# Patient Record
Sex: Male | Born: 1940 | Race: White | Hispanic: No | Marital: Married | Smoking: Former smoker
Health system: Southern US, Community
[De-identification: ages and names within clinical notes are randomized; demographics above are authoritative.]

## PROBLEM LIST (undated history)

## (undated) DIAGNOSIS — K2100 Gastro-esophageal reflux disease with esophagitis, without bleeding: Secondary | ICD-10-CM

## (undated) DIAGNOSIS — M79603 Pain in arm, unspecified: Secondary | ICD-10-CM

## (undated) DIAGNOSIS — E785 Hyperlipidemia, unspecified: Secondary | ICD-10-CM

## (undated) DIAGNOSIS — M545 Low back pain, unspecified: Secondary | ICD-10-CM

## (undated) DIAGNOSIS — H9190 Unspecified hearing loss, unspecified ear: Secondary | ICD-10-CM

## (undated) DIAGNOSIS — M75101 Unspecified rotator cuff tear or rupture of right shoulder, not specified as traumatic: Secondary | ICD-10-CM

## (undated) DIAGNOSIS — K219 Gastro-esophageal reflux disease without esophagitis: Secondary | ICD-10-CM

## (undated) DIAGNOSIS — I10 Essential (primary) hypertension: Secondary | ICD-10-CM

## (undated) DIAGNOSIS — E663 Overweight: Secondary | ICD-10-CM

## (undated) DIAGNOSIS — M109 Gout, unspecified: Secondary | ICD-10-CM

## (undated) DIAGNOSIS — I714 Abdominal aortic aneurysm, without rupture, unspecified: Secondary | ICD-10-CM

## (undated) HISTORY — PX: BACK SURGERY: SHX140

## (undated) HISTORY — DX: Low back pain, unspecified: M54.50

## (undated) HISTORY — DX: Essential (primary) hypertension: I10

## (undated) HISTORY — DX: Pain in arm, unspecified: M79.603

## (undated) HISTORY — DX: Gout, unspecified: M10.9

## (undated) HISTORY — DX: Unspecified hearing loss, unspecified ear: H91.90

## (undated) HISTORY — DX: Low back pain: M54.5

## (undated) HISTORY — DX: Hyperlipidemia, unspecified: E78.5

## (undated) HISTORY — PX: ABDOMINAL AORTIC ANEURYSM REPAIR: SUR1152

## (undated) HISTORY — DX: Overweight: E66.3

## (undated) HISTORY — DX: Gastro-esophageal reflux disease with esophagitis: K21.0

## (undated) HISTORY — DX: Gastro-esophageal reflux disease with esophagitis, without bleeding: K21.00

## (undated) HISTORY — PX: COLONOSCOPY W/ BIOPSIES AND POLYPECTOMY: SHX1376

---
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
## 1999-09-25 ENCOUNTER — Encounter: Payer: Self-pay | Admitting: *Deleted

## 1999-09-26 ENCOUNTER — Ambulatory Visit: Admission: RE | Admit: 1999-09-26 | Discharge: 1999-09-26 | Payer: Self-pay | Admitting: *Deleted

## 1999-10-16 ENCOUNTER — Encounter: Payer: Self-pay | Admitting: *Deleted

## 1999-10-17 ENCOUNTER — Inpatient Hospital Stay (HOSPITAL_COMMUNITY): Admission: RE | Admit: 1999-10-17 | Discharge: 1999-10-23 | Payer: Self-pay | Admitting: *Deleted

## 1999-10-17 ENCOUNTER — Encounter: Payer: Self-pay | Admitting: *Deleted

## 1999-10-18 ENCOUNTER — Encounter: Payer: Self-pay | Admitting: *Deleted

## 1999-10-19 ENCOUNTER — Encounter: Payer: Self-pay | Admitting: *Deleted

## 1999-10-21 ENCOUNTER — Encounter: Payer: Self-pay | Admitting: *Deleted

## 1999-10-22 ENCOUNTER — Encounter: Payer: Self-pay | Admitting: *Deleted

## 2001-01-30 ENCOUNTER — Encounter: Payer: Self-pay | Admitting: Family Medicine

## 2001-01-30 ENCOUNTER — Ambulatory Visit (HOSPITAL_COMMUNITY): Admission: RE | Admit: 2001-01-30 | Discharge: 2001-01-30 | Payer: Self-pay | Admitting: Family Medicine

## 2005-11-01 ENCOUNTER — Ambulatory Visit (HOSPITAL_COMMUNITY): Admission: RE | Admit: 2005-11-01 | Discharge: 2005-11-01 | Payer: Self-pay | Admitting: Otolaryngology

## 2011-12-12 ENCOUNTER — Other Ambulatory Visit: Payer: Self-pay | Admitting: Family Medicine

## 2011-12-20 ENCOUNTER — Ambulatory Visit
Admission: RE | Admit: 2011-12-20 | Discharge: 2011-12-20 | Disposition: A | Discharge status: Home / Self Care | Payer: 59 | Source: Ambulatory Visit | Attending: Family Medicine | Admitting: Family Medicine

## 2011-12-20 ENCOUNTER — Inpatient Hospital Stay: Admission: RE | Admit: 2011-12-20 | Payer: Self-pay | Source: Ambulatory Visit

## 2011-12-20 MED ORDER — IOHEXOL 300 MG/ML  SOLN
100.0000 mL | Freq: Once | INTRAMUSCULAR | Status: AC | PRN
  Administered 2011-12-20: 100 mL via INTRAVENOUS

## 2012-11-23 ENCOUNTER — Other Ambulatory Visit: Payer: Self-pay | Admitting: Gastroenterology

## 2014-05-02 ENCOUNTER — Encounter: Payer: Self-pay | Admitting: Neurology

## 2014-05-02 DIAGNOSIS — K2100 Gastro-esophageal reflux disease with esophagitis, without bleeding: Secondary | ICD-10-CM

## 2014-05-02 DIAGNOSIS — M545 Low back pain, unspecified: Secondary | ICD-10-CM

## 2014-05-02 DIAGNOSIS — M109 Gout, unspecified: Secondary | ICD-10-CM

## 2014-05-02 DIAGNOSIS — M79603 Pain in arm, unspecified: Secondary | ICD-10-CM

## 2014-05-02 DIAGNOSIS — E663 Overweight: Secondary | ICD-10-CM

## 2014-05-02 DIAGNOSIS — E785 Hyperlipidemia, unspecified: Secondary | ICD-10-CM

## 2014-05-02 DIAGNOSIS — H9190 Unspecified hearing loss, unspecified ear: Secondary | ICD-10-CM

## 2014-05-03 ENCOUNTER — Ambulatory Visit (INDEPENDENT_AMBULATORY_CARE_PROVIDER_SITE_OTHER): Payer: 59 | Admitting: Neurology

## 2014-05-03 ENCOUNTER — Encounter (INDEPENDENT_AMBULATORY_CARE_PROVIDER_SITE_OTHER): Payer: Self-pay

## 2014-05-03 ENCOUNTER — Encounter: Payer: Self-pay | Admitting: Neurology

## 2014-05-03 VITALS — BP 118/66 | HR 60 | Ht 68.0 in | Wt 161.5 lb

## 2014-05-03 DIAGNOSIS — IMO0002 Reserved for concepts with insufficient information to code with codable children: Secondary | ICD-10-CM

## 2014-05-03 DIAGNOSIS — Z9889 Other specified postprocedural states: Secondary | ICD-10-CM

## 2014-05-03 DIAGNOSIS — M5416 Radiculopathy, lumbar region: Secondary | ICD-10-CM | POA: Insufficient documentation

## 2014-05-03 DIAGNOSIS — Z8679 Personal history of other diseases of the circulatory system: Secondary | ICD-10-CM | POA: Insufficient documentation

## 2014-05-03 NOTE — Progress Notes (Signed)
PATIENT: Kyle Greene DOB: 05/09/1941  HISTORICAL  Kyle Greene is a 73 years old right-handed male, accompanied by his wife, referred by his primary care Dr. Redmond Greene for evaluation of back, and leg pain  He had low back decompression surgery twice, the first one in 1985, for low back pain, right lower extremity radicular pain, the second one around 2000, again presenting with right sided low back pain, radiating pain to his right lower extremity, the second surgery was done by Dr. Joya Greene, surgery has helped his symptoms much, but he has persistent right foot numbness, he denies right leg weakness, denies gait difficulty  There was incidental finding of aortic aneurysm during the MRI study then, eventually underwent aortic aneurysm repair in 2005,  He is still working full-time, he does maintain his job, there was no significant difficulty, but over the past 2-3 months, since February 2015, he complained of recurrence of his right-sided low back pain, radiating pain to her right buttock, right lateral thigh, right lateral leg to the top of his right foot, worsening with prolonged standing, and walking,  He denies left leg symptoms, he denies bowel and bladder incontinence.,  He is taking ibuprofen 2 tablets every 6 hours, previously has tried Mobic, without help,    REVIEW OF SYSTEMS: Full 14 system review of systems performed and notable only for low back pain,  ALLERGIES: Allergies  Allergen Reactions  . Biaxin [Clarithromycin]   . Zocor [Simvastatin]     HOME MEDICATIONS: Current Outpatient Prescriptions on File Prior to Visit  Medication Sig Dispense Refill  . indomethacin (INDOCIN) 50 MG capsule Take 50 mg by mouth 2 (two) times daily with a meal.      . ranitidine (ZANTAC) 150 MG tablet Take 150 mg by mouth 2 (two) times daily.       No current facility-administered medications on file prior to visit.    PAST MEDICAL HISTORY: Past Medical History  Diagnosis Date    . Low back pain   . Arm pain   . Reflux esophagitis   . Gouty arthropathy   . Hearing loss   . Hyperlipemia   . High blood pressure   . Over weight     PAST SURGICAL HISTORY: Past Surgical History  Procedure Laterality Date  . Back surgery      x2    FAMILY HISTORY: Family History  Problem Relation Age of Onset  . Cancer Father   . COPD Mother     SOCIAL HISTORY:  History   Social History  . Marital Status: Married    Spouse Name: Kyle Greene    Number of Children: 1  . Years of Education: 10   Occupational History  .  [employer] Kyle Greene, maintence, climb,    Social History Main Topics  . Smoking status: Never Smoker   . Smokeless tobacco: Never Used     Comment: Quit 20 years ago.  . Alcohol Use: No  . Drug Use: No  . Sexual Activity: Not on file   Other Topics Concern  . Not on file   Social History Narrative   Patient lives at home with his wife Kyle Greene)   Patient works full time Education administrator.   Right handed.   Caffeine five [employer] dews daily.    PHYSICAL EXAM   Filed Vitals:   05/03/14 1427  BP: 118/66  Pulse: 60  Height: 5\' 8"  (1.727 m)  Weight: 161 lb 8 oz (73.256 kg)  Not recorded    Body mass index is 24.56 kg/(m^2).   Generalized: In no acute distress  Neck: Supple, no carotid bruits   Cardiac: Regular rate rhythm  Pulmonary: Clear to auscultation bilaterally  Musculoskeletal: No deformity  Neurological examination  Mentation: Alert oriented to time, place, history taking, and causual conversation  Cranial nerve II-XII: Pupils were equal round reactive to light. Extraocular movements were full.  Visual field were full on confrontational test. Bilateral fundi were sharp.  Facial sensation and strength were normal. Hearing was intact to finger rubbing bilaterally. Uvula tongue midline.  Head turning and shoulder shrug and were normal and symmetric.Tongue protrusion into cheek strength was normal.  Motor: He has mild right toe  extension weakness, right straight leg testing shoulder radiating pain from his right back to his right leg   Sensory: Intact to fine touch, pinprick, preserved vibratory sensation, and proprioception at toes.  Coordination: Normal finger to nose, heel-to-shin bilaterally there was no truncal ataxia  Gait: Rising up from seated position without assistance, normal stance, without trunk ataxia, moderate stride, good arm swing, smooth turning, able to perform tiptoe, and heel walking without difficulty.   Romberg signs: Negative  Deep tendon reflexes: Brachioradialis 2/2, biceps 2/2, triceps 2/2, patellar 2/2, Achilles 2/2, plantar responses were flexor bilaterally.   DIAGNOSTIC DATA (LABS, IMAGING, TESTING) - I reviewed patient records, labs, notes, testing and imaging myself where available.  ASSESSMENT AND PLAN  Kyle Greene is a 73 y.o. male complains of   right-sided low back pain, radiating pain to her right leg, consistent with right L5 radiculopathy  1, MRI of lumbar 2. EMG nerve conduction study 3 may consider gabapentin if he continued to have pain,.    Kyle Greene, M.D. Ph.D.  [facility] [address] [place], [place] [address] [phone]

## 2014-05-05 ENCOUNTER — Telehealth: Payer: Self-pay | Admitting: Neurology

## 2014-05-05 ENCOUNTER — Other Ambulatory Visit: Payer: Self-pay | Admitting: Neurology

## 2014-05-05 ENCOUNTER — Ambulatory Visit (INDEPENDENT_AMBULATORY_CARE_PROVIDER_SITE_OTHER): Payer: 59

## 2014-05-05 DIAGNOSIS — Z9889 Other specified postprocedural states: Secondary | ICD-10-CM

## 2014-05-05 DIAGNOSIS — M5416 Radiculopathy, lumbar region: Secondary | ICD-10-CM

## 2014-05-05 DIAGNOSIS — Z8679 Personal history of other diseases of the circulatory system: Secondary | ICD-10-CM

## 2014-05-05 DIAGNOSIS — IMO0002 Reserved for concepts with insufficient information to code with codable children: Secondary | ICD-10-CM

## 2014-05-05 NOTE — Telephone Encounter (Signed)
Pt calling stating that the Ibuprofen is not helping his back pain and would like to know if Dr. Krista Blue could prescribe something else for him. Please advise

## 2014-05-05 NOTE — Telephone Encounter (Signed)
Patient calling to state that his Ibuprofen isn't working for his back pain and would like something to be called in for him.

## 2014-05-05 NOTE — Telephone Encounter (Signed)
Butch Penny, please let patient know, I have Rx in gabapentin 300mg  tid for him, Give him an appt for EMG/NCS and MRI soon,

## 2014-05-06 MED ORDER — GABAPENTIN 100 MG PO CAPS: 300.0000 mg | ORAL_CAPSULE | Freq: Three times a day (TID) | ORAL | Status: DC

## 2014-05-06 NOTE — Telephone Encounter (Signed)
Patient calling back, still awaiting a return call.  Thanks

## 2014-05-06 NOTE — Telephone Encounter (Signed)
Patient calling to check on status of this message, please call and advise patient.

## 2014-05-06 NOTE — Telephone Encounter (Signed)
Left message that the doctor sent a prescription for Gabapentin to [facility].  Also he will get a call to schedule the EMG/NCV and MRI.

## 2014-05-09 ENCOUNTER — Telehealth: Payer: Self-pay | Admitting: Neurology

## 2014-05-10 ENCOUNTER — Telehealth: Payer: Self-pay | Admitting: *Deleted

## 2014-05-10 DIAGNOSIS — M5416 Radiculopathy, lumbar region: Secondary | ICD-10-CM

## 2014-05-10 MED ORDER — OXYCODONE-IBUPROFEN 5-400 MG PO TABS: 1.0000 | ORAL_TABLET | Freq: Four times a day (QID) | ORAL | Status: DC | PRN

## 2014-05-10 NOTE — Telephone Encounter (Signed)
Pt calling stating that the medication Gabapentin that Dr. Krista Blue prescribed is not helping and per note on 05/09/14, Dr. Leonie Man advised the pt to stop the Gabapentin and call Dr. Krista Blue. Pt states that the medication is making him dizzy and requesting pain medication. Please advise

## 2014-05-10 NOTE — Telephone Encounter (Signed)
Patient calling again to check on the status of this message, says that his medication is not helping him. Please call and advise patient.

## 2014-05-11 ENCOUNTER — Telehealth: Payer: Self-pay | Admitting: Neurology

## 2014-05-11 MED ORDER — HYDROCODONE-IBUPROFEN 7.5-200 MG PO TABS: 1.0000 | ORAL_TABLET | Freq: Three times a day (TID) | ORAL | Status: DC | PRN

## 2014-05-11 NOTE — Telephone Encounter (Signed)
I called the patient.  He is aware the Rx has been changed.

## 2014-05-11 NOTE — Telephone Encounter (Signed)
Patient brought in Rx to pharmacy for Oxycodone Ibuprofen but medication is not available--Hydrocodone with Ibuprofen is available but has less strength--if changing Rx patient will need a new written Rx--please call and advise--thank you.

## 2014-05-11 NOTE — Telephone Encounter (Signed)
Patient is requesting a med change from Combunox to Vicoprofen because Combunox is not available at this time.  If change is authorized, patient will need a new written Rx.  Please advise.  Thank you.

## 2014-05-11 NOTE — Telephone Encounter (Signed)
Kyle Greene, I changed his rx, please let him know, it is ready for him.

## 2014-05-12 ENCOUNTER — Telehealth: Payer: Self-pay | Admitting: Neurology

## 2014-05-12 DIAGNOSIS — M545 Low back pain, unspecified: Secondary | ICD-10-CM

## 2014-05-12 DIAGNOSIS — M5416 Radiculopathy, lumbar region: Secondary | ICD-10-CM

## 2014-05-12 NOTE — Telephone Encounter (Signed)
Pt picked up his Rx today. °

## 2014-05-13 ENCOUNTER — Telehealth: Payer: Self-pay | Admitting: Neurology

## 2014-05-13 NOTE — Telephone Encounter (Signed)
Patient requesting a referral to Neuro Surgeon and requesting MRI results sent as well.  He's questioning if NCV/EMG is needed on 5/26.  Please call and advise.

## 2014-05-13 NOTE — Telephone Encounter (Signed)
Patient wants to cancel nerve conduction study but wants a call back from Dr. Krista Blue or a nurse first due to other questions that he has. Please call to advise.

## 2014-05-13 NOTE — Telephone Encounter (Signed)
I have called Mr. Kyle Greene, he is hesitate about further follow up with [place], continues to have significant pain,   Hinton Dyer, please get MRI cd lumbar ready for him to pick up, I have referred him to a neurosurgeon Dr. Saunders Revel, please also cancel his appointment with me in May 26, fill with a a new patient.

## 2014-05-17 ENCOUNTER — Encounter: Payer: 59 | Admitting: Neurology

## 2014-05-17 NOTE — Telephone Encounter (Signed)
Faxed Notes and order to Dr.[facility] office in [place]. Patient will be here today to pick up MRI Disk to take to his appt.

## 2014-05-18 NOTE — Telephone Encounter (Signed)
Called pt to see if pt's matter was taken care of and pt stated that he picked up his MRI disc yesterday and I informed the pt the his referral had been sent over to Dr. Eduard Clos office and that his appt for the EMG had been cancelled. I advised the pt that if he has any other problems, questions or concerns to call the office. Pt verbalized understanding.

## 2014-05-20 NOTE — Telephone Encounter (Signed)
Correction the referral was sent to Dr. Glenna Fellows.

## 2014-05-25 NOTE — Telephone Encounter (Signed)
Received appt confirmation from [place] [facility] with Dr. Joya Salm on 05-30-14 at 1000.  Pt informed of this appt date and time per [facility]. [phone]

## 2014-06-09 ENCOUNTER — Other Ambulatory Visit: Payer: Self-pay | Admitting: Neurosurgery

## 2014-06-13 ENCOUNTER — Encounter (HOSPITAL_COMMUNITY): Payer: Self-pay | Admitting: Pharmacy Technician

## 2014-06-14 ENCOUNTER — Encounter (HOSPITAL_COMMUNITY): Payer: Self-pay | Admitting: *Deleted

## 2014-06-14 MED ORDER — CEFAZOLIN SODIUM-DEXTROSE 2-3 GM-% IV SOLR
2.0000 g | INTRAVENOUS | Status: AC
  Administered 2014-06-15: 2 g via INTRAVENOUS
  Filled 2014-06-14: qty 50

## 2014-06-14 NOTE — H&P (Signed)
Kyle Greene is an 73 y.o. male.   Chief Complaint: right leg pain HPI: patient with a history of 3 months with right leg pain associated with heaviness, weakness and numbness.he had surgery by me in the  Same area about 20 y/a. He wants to go ahead with surgery asap  Past Medical History  Diagnosis Date  . Low back pain   . Arm pain   . Reflux esophagitis   . Gouty arthropathy   . Hearing loss   . Hyperlipemia   . High blood pressure   . Over weight     Past Surgical History  Procedure Laterality Date  . Back surgery      x2  . Colonoscopy w/ biopsies and polypectomy    . Abdominal aortic aneurysm repair      Family History  Problem Relation Age of Onset  . Cancer Father   . COPD Mother    Social History:  reports that he has quit smoking. His smoking use included Cigarettes. He smoked 0.00 packs per day. He has never used smokeless tobacco. He reports that he does not drink alcohol or use illicit drugs.  Allergies:  Allergies  Allergen Reactions  . Zocor [Simvastatin] Other (See Comments)    "sore muscles"    No prescriptions prior to admission    No results found for this or any previous visit (from the past 48 hour(s)). No results found.  Review of Systems  Constitutional: Negative.   HENT: Negative.   Eyes: Negative.   Respiratory: Negative.   Cardiovascular:       Arterial hypertension  Gastrointestinal: Negative.   Genitourinary: Negative.   Musculoskeletal: Positive for back pain.  Skin: Negative.   Neurological: Positive for sensory change and focal weakness.  Endo/Heme/Allergies: Negative.   Psychiatric/Behavioral: Negative.     There were no vitals taken for this visit. Physical Exam hent, nl. Neck, nl. Cv, nl. Lungs, clear, abdomen, soft. Extremities, nl. NEURO WEAKNESS OF df RIGHT LEG. 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    Assessment/Plan PATIENT TO BE ADMITTED FOR RIGHT 3-4  4-5 LAMINOTOMIES AND  DISCECTOMIES. AWARE OF RISKS AND BENEFITS  Greene,Kyle M 06/14/2014, 4:32 PM

## 2014-06-15 ENCOUNTER — Encounter (HOSPITAL_COMMUNITY): Payer: Self-pay | Admitting: *Deleted

## 2014-06-15 ENCOUNTER — Ambulatory Visit (HOSPITAL_COMMUNITY): Payer: 59 | Admitting: Anesthesiology

## 2014-06-15 ENCOUNTER — Ambulatory Visit (HOSPITAL_COMMUNITY): Payer: 59

## 2014-06-15 ENCOUNTER — Encounter (HOSPITAL_COMMUNITY): Payer: 59 | Admitting: Anesthesiology

## 2014-06-15 ENCOUNTER — Inpatient Hospital Stay (HOSPITAL_COMMUNITY): Payer: 59

## 2014-06-15 ENCOUNTER — Encounter (HOSPITAL_COMMUNITY)
Admission: RE | Disposition: A | Discharge status: Home / Self Care | Payer: Self-pay | Source: Ambulatory Visit | Attending: Neurosurgery

## 2014-06-15 ENCOUNTER — Inpatient Hospital Stay (HOSPITAL_COMMUNITY)
Admission: RE | Admit: 2014-06-15 | Discharge: 2014-06-17 | DRG: 520 | Disposition: A | Discharge status: Home / Self Care | Payer: 59 | Source: Ambulatory Visit | Attending: Neurosurgery | Admitting: Neurosurgery

## 2014-06-15 DIAGNOSIS — M545 Low back pain, unspecified: Secondary | ICD-10-CM | POA: Diagnosis present

## 2014-06-15 DIAGNOSIS — Z87891 Personal history of nicotine dependence: Secondary | ICD-10-CM

## 2014-06-15 DIAGNOSIS — M109 Gout, unspecified: Secondary | ICD-10-CM | POA: Diagnosis present

## 2014-06-15 DIAGNOSIS — Z79899 Other long term (current) drug therapy: Secondary | ICD-10-CM | POA: Diagnosis not present

## 2014-06-15 DIAGNOSIS — M5126 Other intervertebral disc displacement, lumbar region: Principal | ICD-10-CM | POA: Diagnosis present

## 2014-06-15 DIAGNOSIS — M47817 Spondylosis without myelopathy or radiculopathy, lumbosacral region: Secondary | ICD-10-CM | POA: Diagnosis present

## 2014-06-15 DIAGNOSIS — E785 Hyperlipidemia, unspecified: Secondary | ICD-10-CM | POA: Diagnosis present

## 2014-06-15 DIAGNOSIS — H9190 Unspecified hearing loss, unspecified ear: Secondary | ICD-10-CM | POA: Diagnosis present

## 2014-06-15 HISTORY — PX: LUMBAR LAMINECTOMY/DECOMPRESSION MICRODISCECTOMY: SHX5026

## 2014-06-15 LAB — BASIC METABOLIC PANEL
BUN: 16 mg/dL (ref 6–23)
CALCIUM: 9.7 mg/dL (ref 8.4–10.5)
CO2: 25 mEq/L (ref 19–32)
CREATININE: 1.05 mg/dL (ref 0.50–1.35)
Chloride: 99 mEq/L (ref 96–112)
GFR, EST AFRICAN AMERICAN: 80 mL/min — AB (ref >90)
GFR, EST NON AFRICAN AMERICAN: 69 mL/min — AB (ref >90)
Glucose, Bld: 92 mg/dL (ref 70–99)
POTASSIUM: 4.2 meq/L (ref 3.7–5.3)
Sodium: 139 mEq/L (ref 137–147)

## 2014-06-15 LAB — CBC
HCT: 43.5 % (ref 39.0–52.0)
HEMOGLOBIN: 15.1 g/dL (ref 13.0–17.0)
MCH: 30.4 pg (ref 26.0–34.0)
MCHC: 34.7 g/dL (ref 30.0–36.0)
MCV: 87.5 fL (ref 78.0–100.0)
Platelets: 243 10*3/uL (ref 150–400)
RBC: 4.97 MIL/uL (ref 4.22–5.81)
RDW: 12.9 % (ref 11.5–15.5)
WBC: 10.5 10*3/uL (ref 4.0–10.5)

## 2014-06-15 LAB — SURGICAL PCR SCREEN
MRSA, PCR: NEGATIVE
Staphylococcus aureus: POSITIVE — AB

## 2014-06-15 SURGERY — LUMBAR LAMINECTOMY/DECOMPRESSION MICRODISCECTOMY 2 LEVELS
Anesthesia: General | Laterality: Right

## 2014-06-15 MED ORDER — FENTANYL CITRATE 0.05 MG/ML IJ SOLN
INTRAMUSCULAR | Status: DC | PRN
  Administered 2014-06-15: 50 ug via INTRAVENOUS
  Administered 2014-06-15: 150 ug via INTRAVENOUS
  Administered 2014-06-15: 50 ug via INTRAVENOUS

## 2014-06-15 MED ORDER — GLYCOPYRROLATE 0.2 MG/ML IJ SOLN
INTRAMUSCULAR | Status: DC | PRN
  Administered 2014-06-15: .4 mg via INTRAVENOUS

## 2014-06-15 MED ORDER — 0.9 % SODIUM CHLORIDE (POUR BTL) OPTIME
TOPICAL | Status: DC | PRN
  Administered 2014-06-15: 1000 mL

## 2014-06-15 MED ORDER — THROMBIN 5000 UNITS EX SOLR
CUTANEOUS | Status: DC | PRN
  Administered 2014-06-15 (×2): 5000 [IU] via TOPICAL

## 2014-06-15 MED ORDER — ROCURONIUM BROMIDE 100 MG/10ML IV SOLN
INTRAVENOUS | Status: DC | PRN
  Administered 2014-06-15: 50 mg via INTRAVENOUS

## 2014-06-15 MED ORDER — EPHEDRINE SULFATE 50 MG/ML IJ SOLN
INTRAMUSCULAR | Status: DC | PRN
  Administered 2014-06-15: 10 mg via INTRAVENOUS

## 2014-06-15 MED ORDER — ONDANSETRON HCL 4 MG/2ML IJ SOLN: 4.0000 mg | INTRAMUSCULAR | Status: DC | PRN

## 2014-06-15 MED ORDER — HEMOSTATIC AGENTS (NO CHARGE) OPTIME
TOPICAL | Status: DC | PRN
  Administered 2014-06-15: 1 via TOPICAL

## 2014-06-15 MED ORDER — FENTANYL CITRATE 0.05 MG/ML IJ SOLN
INTRAMUSCULAR | Status: AC
  Filled 2014-06-15: qty 5

## 2014-06-15 MED ORDER — OXYCODONE HCL 5 MG/5ML PO SOLN: 5.0000 mg | Freq: Once | ORAL | Status: DC | PRN

## 2014-06-15 MED ORDER — CEFAZOLIN SODIUM 1-5 GM-% IV SOLN
1.0000 g | Freq: Three times a day (TID) | INTRAVENOUS | Status: AC
  Administered 2014-06-15 – 2014-06-16 (×2): 1 g via INTRAVENOUS
  Filled 2014-06-15 (×3): qty 50

## 2014-06-15 MED ORDER — PHENOL 1.4 % MT LIQD: 1.0000 | OROMUCOSAL | Status: DC | PRN

## 2014-06-15 MED ORDER — MUPIROCIN 2 % EX OINT
TOPICAL_OINTMENT | Freq: Two times a day (BID) | CUTANEOUS | Status: DC
  Administered 2014-06-15: 23:00:00 via NASAL
  Administered 2014-06-15: 1 via NASAL
  Administered 2014-06-16: 10:00:00 via NASAL
  Filled 2014-06-15: qty 22

## 2014-06-15 MED ORDER — BUPIVACAINE-EPINEPHRINE (PF) 0.5% -1:200000 IJ SOLN
INTRAMUSCULAR | Status: DC | PRN
  Administered 2014-06-15: 20 mL via PERINEURAL

## 2014-06-15 MED ORDER — SODIUM CHLORIDE 0.9 % IV SOLN: 250.0000 mL | INTRAVENOUS | Status: DC

## 2014-06-15 MED ORDER — ACETAMINOPHEN 650 MG RE SUPP: 650.0000 mg | RECTAL | Status: DC | PRN

## 2014-06-15 MED ORDER — SODIUM CHLORIDE 0.9 % IJ SOLN
3.0000 mL | Freq: Two times a day (BID) | INTRAMUSCULAR | Status: DC
  Administered 2014-06-15 – 2014-06-16 (×3): 3 mL via INTRAVENOUS

## 2014-06-15 MED ORDER — FENTANYL CITRATE 0.05 MG/ML IJ SOLN
INTRAMUSCULAR | Status: AC
  Filled 2014-06-15: qty 2

## 2014-06-15 MED ORDER — SODIUM CHLORIDE 0.9 % IV SOLN
INTRAVENOUS | Status: DC
  Administered 2014-06-16: 02:00:00 via INTRAVENOUS

## 2014-06-15 MED ORDER — FAMOTIDINE 20 MG PO TABS
20.0000 mg | ORAL_TABLET | Freq: Every day | ORAL | Status: DC
  Administered 2014-06-16 – 2014-06-17 (×2): 20 mg via ORAL
  Filled 2014-06-15 (×2): qty 1

## 2014-06-15 MED ORDER — MORPHINE SULFATE 2 MG/ML IJ SOLN: 1.0000 mg | INTRAMUSCULAR | Status: DC | PRN

## 2014-06-15 MED ORDER — ACETAMINOPHEN 325 MG PO TABS: 650.0000 mg | ORAL_TABLET | ORAL | Status: DC | PRN

## 2014-06-15 MED ORDER — METHYLPREDNISOLONE ACETATE 80 MG/ML IJ SUSP
INTRAMUSCULAR | Status: DC | PRN
  Administered 2014-06-15: 80 mg

## 2014-06-15 MED ORDER — MEPERIDINE HCL 25 MG/ML IJ SOLN: 6.2500 mg | INTRAMUSCULAR | Status: DC | PRN

## 2014-06-15 MED ORDER — FENTANYL CITRATE 0.05 MG/ML IJ SOLN
INTRAMUSCULAR | Status: DC | PRN
  Administered 2014-06-15: 100 ug via INTRAVENOUS

## 2014-06-15 MED ORDER — ZOLPIDEM TARTRATE 5 MG PO TABS: 5.0000 mg | ORAL_TABLET | Freq: Every evening | ORAL | Status: DC | PRN

## 2014-06-15 MED ORDER — MIDAZOLAM HCL 2 MG/2ML IJ SOLN
INTRAMUSCULAR | Status: AC
  Filled 2014-06-15: qty 2

## 2014-06-15 MED ORDER — LACTATED RINGERS IV SOLN
INTRAVENOUS | Status: DC
  Administered 2014-06-15: 14:00:00 via INTRAVENOUS
  Administered 2014-06-15: 50 mL/h via INTRAVENOUS

## 2014-06-15 MED ORDER — ONDANSETRON HCL 4 MG/2ML IJ SOLN
INTRAMUSCULAR | Status: DC | PRN
Start: 2014-06-15 — End: 2014-06-15
  Administered 2014-06-15: 4 mg via INTRAVENOUS

## 2014-06-15 MED ORDER — NEOSTIGMINE METHYLSULFATE 10 MG/10ML IV SOLN
INTRAVENOUS | Status: DC | PRN
  Administered 2014-06-15: 3 mg via INTRAVENOUS

## 2014-06-15 MED ORDER — SODIUM CHLORIDE 0.9 % IJ SOLN: 3.0000 mL | INTRAMUSCULAR | Status: DC | PRN

## 2014-06-15 MED ORDER — HYDROCHLOROTHIAZIDE 12.5 MG PO CAPS
12.5000 mg | ORAL_CAPSULE | Freq: Every day | ORAL | Status: DC
  Administered 2014-06-16 – 2014-06-17 (×2): 12.5 mg via ORAL
  Filled 2014-06-15 (×2): qty 1

## 2014-06-15 MED ORDER — LIDOCAINE HCL (CARDIAC) 20 MG/ML IV SOLN
INTRAVENOUS | Status: DC | PRN
  Administered 2014-06-15: 100 mg via INTRAVENOUS

## 2014-06-15 MED ORDER — MENTHOL 3 MG MT LOZG
1.0000 | LOZENGE | OROMUCOSAL | Status: DC | PRN
  Administered 2014-06-15: 3 mg via ORAL
  Filled 2014-06-15: qty 9

## 2014-06-15 MED ORDER — ONDANSETRON HCL 4 MG/2ML IJ SOLN: 4.0000 mg | Freq: Once | INTRAMUSCULAR | Status: DC | PRN

## 2014-06-15 MED ORDER — OXYCODONE HCL 5 MG PO TABS: 5.0000 mg | ORAL_TABLET | Freq: Once | ORAL | Status: DC | PRN

## 2014-06-15 MED ORDER — DIAZEPAM 5 MG PO TABS
5.0000 mg | ORAL_TABLET | Freq: Four times a day (QID) | ORAL | Status: DC | PRN
  Administered 2014-06-16: 5 mg via ORAL
  Filled 2014-06-15: qty 1

## 2014-06-15 MED ORDER — HYDROMORPHONE HCL PF 1 MG/ML IJ SOLN
INTRAMUSCULAR | Status: AC
  Filled 2014-06-15: qty 1

## 2014-06-15 MED ORDER — HYDROMORPHONE HCL PF 1 MG/ML IJ SOLN
0.2500 mg | INTRAMUSCULAR | Status: DC | PRN
  Administered 2014-06-15: 0.5 mg via INTRAVENOUS

## 2014-06-15 MED ORDER — MUPIROCIN 2 % EX OINT
TOPICAL_OINTMENT | CUTANEOUS | Status: AC
  Filled 2014-06-15: qty 22

## 2014-06-15 MED ORDER — MIDAZOLAM HCL 5 MG/5ML IJ SOLN
INTRAMUSCULAR | Status: DC | PRN
  Administered 2014-06-15: 2 mg via INTRAVENOUS

## 2014-06-15 MED ORDER — OXYCODONE-ACETAMINOPHEN 5-325 MG PO TABS
1.0000 | ORAL_TABLET | ORAL | Status: DC | PRN
  Administered 2014-06-16: 2 via ORAL
  Filled 2014-06-15: qty 2

## 2014-06-15 MED ORDER — PROPOFOL 10 MG/ML IV BOLUS
INTRAVENOUS | Status: DC | PRN
  Administered 2014-06-15: 110 mg via INTRAVENOUS

## 2014-06-15 SURGICAL SUPPLY — 61 items
APL SKNCLS STERI-STRIP NONHPOA (GAUZE/BANDAGES/DRESSINGS) ×1
BENZOIN TINCTURE PRP APPL 2/3 (GAUZE/BANDAGES/DRESSINGS) ×3 IMPLANT
BLADE 10 SAFETY STRL DISP (BLADE) ×3 IMPLANT
BLADE SURG ROTATE 9660 (MISCELLANEOUS) ×2 IMPLANT
BUR ACORN 6.0 (BURR) ×1 IMPLANT
BUR ACORN 6.0MM (BURR) ×1
BUR MATCHSTICK NEURO 3.0 LAGG (BURR) ×3 IMPLANT
CANISTER SUCT 3000ML (MISCELLANEOUS) ×3 IMPLANT
CLOSURE WOUND 1/2 X4 (GAUZE/BANDAGES/DRESSINGS) ×1
CONT SPEC 4OZ CLIKSEAL STRL BL (MISCELLANEOUS) ×3 IMPLANT
DRAPE LAPAROTOMY 100X72X124 (DRAPES) ×5 IMPLANT
DRAPE POUCH INSTRU U-SHP 10X18 (DRAPES) ×3 IMPLANT
DRSG OPSITE 4X5.5 SM (GAUZE/BANDAGES/DRESSINGS) ×2 IMPLANT
DURAPREP 26ML APPLICATOR (WOUND CARE) ×3 IMPLANT
ELECT REM PT RETURN 9FT ADLT (ELECTROSURGICAL) ×3
ELECTRODE REM PT RTRN 9FT ADLT (ELECTROSURGICAL) ×1 IMPLANT
GAUZE SPONGE 4X4 16PLY XRAY LF (GAUZE/BANDAGES/DRESSINGS) ×2 IMPLANT
GLOVE BIOGEL M 8.0 STRL (GLOVE) ×3 IMPLANT
GLOVE BIOGEL PI IND STRL 7.5 (GLOVE) IMPLANT
GLOVE BIOGEL PI INDICATOR 7.5 (GLOVE) ×6
GLOVE EXAM NITRILE LRG STRL (GLOVE) IMPLANT
GLOVE EXAM NITRILE MD LF STRL (GLOVE) IMPLANT
GLOVE EXAM NITRILE XL STR (GLOVE) IMPLANT
GLOVE EXAM NITRILE XS STR PU (GLOVE) IMPLANT
GLOVE SURG SS PI 7.0 STRL IVOR (GLOVE) ×8 IMPLANT
GOWN STRL REUS W/ TWL LRG LVL3 (GOWN DISPOSABLE) ×1 IMPLANT
GOWN STRL REUS W/ TWL XL LVL3 (GOWN DISPOSABLE) IMPLANT
GOWN STRL REUS W/TWL 2XL LVL3 (GOWN DISPOSABLE) IMPLANT
GOWN STRL REUS W/TWL LRG LVL3 (GOWN DISPOSABLE) ×6
GOWN STRL REUS W/TWL XL LVL3 (GOWN DISPOSABLE) ×9
KIT BASIN OR (CUSTOM PROCEDURE TRAY) ×3 IMPLANT
KIT ROOM TURNOVER OR (KITS) ×3 IMPLANT
NDL HYPO 18GX1.5 BLUNT FILL (NEEDLE) IMPLANT
NDL HYPO 21X1.5 SAFETY (NEEDLE) IMPLANT
NDL HYPO 25X1 1.5 SAFETY (NEEDLE) IMPLANT
NDL SPNL 20GX3.5 QUINCKE YW (NEEDLE) IMPLANT
NEEDLE HYPO 18GX1.5 BLUNT FILL (NEEDLE) IMPLANT
NEEDLE HYPO 21X1.5 SAFETY (NEEDLE) IMPLANT
NEEDLE HYPO 25X1 1.5 SAFETY (NEEDLE) ×3 IMPLANT
NEEDLE SPNL 20GX3.5 QUINCKE YW (NEEDLE) IMPLANT
NS IRRIG 1000ML POUR BTL (IV SOLUTION) ×3 IMPLANT
PACK LAMINECTOMY NEURO (CUSTOM PROCEDURE TRAY) ×3 IMPLANT
PAD ABD 8X10 STRL (GAUZE/BANDAGES/DRESSINGS) IMPLANT
PAD ARMBOARD 7.5X6 YLW CONV (MISCELLANEOUS) ×9 IMPLANT
PATTIES SURGICAL .5 X1 (DISPOSABLE) ×3 IMPLANT
RUBBERBAND STERILE (MISCELLANEOUS) ×6 IMPLANT
SPONGE GAUZE 4X4 12PLY (GAUZE/BANDAGES/DRESSINGS) ×3 IMPLANT
SPONGE LAP 4X18 X RAY DECT (DISPOSABLE) IMPLANT
SPONGE SURGIFOAM ABS GEL SZ50 (HEMOSTASIS) ×3 IMPLANT
STRIP CLOSURE SKIN 1/2X4 (GAUZE/BANDAGES/DRESSINGS) ×2 IMPLANT
SUT VIC AB 0 CT1 18XCR BRD8 (SUTURE) ×1 IMPLANT
SUT VIC AB 0 CT1 8-18 (SUTURE) ×3
SUT VIC AB 2-0 CP2 18 (SUTURE) ×3 IMPLANT
SUT VIC AB 3-0 SH 8-18 (SUTURE) ×3 IMPLANT
SYR 20CC LL (SYRINGE) IMPLANT
SYR 20ML ECCENTRIC (SYRINGE) ×3 IMPLANT
SYR 5ML LL (SYRINGE) IMPLANT
TAPE CLOTH SURG 4X10 WHT LF (GAUZE/BANDAGES/DRESSINGS) ×2 IMPLANT
TOWEL OR 17X24 6PK STRL BLUE (TOWEL DISPOSABLE) ×3 IMPLANT
TOWEL OR 17X26 10 PK STRL BLUE (TOWEL DISPOSABLE) ×3 IMPLANT
WATER STERILE IRR 1000ML POUR (IV SOLUTION) ×3 IMPLANT

## 2014-06-15 NOTE — Anesthesia Postprocedure Evaluation (Signed)
Anesthesia Post Note  Patient: Kyle Greene  Procedure(s) Performed: Procedure(s) (LRB): LUMBAR THREE TO FOUR, LUMBAR FOUR TO FIVE LUMBAR LAMINECTOMY/DECOMPRESSION MICRODISCECTOMY 2 LEVELS (Right)  Anesthesia type: general  Patient location: PACU  Post pain: Pain level controlled  Post assessment: Patient's Cardiovascular Status Stable  Last Vitals:  Filed Vitals:   06/15/14 1611  BP:   Pulse: 56  Temp: 36 C  Resp: 15    Post vital signs: Reviewed and stable  Level of consciousness: sedated  Complications: No apparent anesthesia complications

## 2014-06-15 NOTE — Transfer of Care (Signed)
Immediate Anesthesia Transfer of Care Note  Patient: Kyle Greene  Procedure(s) Performed: Procedure(s) with comments: LUMBAR THREE TO FOUR, LUMBAR FOUR TO FIVE LUMBAR LAMINECTOMY/DECOMPRESSION MICRODISCECTOMY 2 LEVELS (Right) - right L3-4 L4-5 Microdiskectomy  Patient Location: PACU  Anesthesia Type:General  Level of Consciousness: awake, alert  and oriented  Airway & Oxygen Therapy: Patient Spontanous Breathing and Patient connected to nasal cannula oxygen  Post-op Assessment: Report given to PACU RN and Post -op Vital signs reviewed and stable  Post vital signs: Reviewed and stable  Complications: No apparent anesthesia complications

## 2014-06-15 NOTE — Plan of Care (Signed)
Problem: Consults Goal: Diagnosis - Spinal Surgery Microdiscectomy

## 2014-06-15 NOTE — Anesthesia Preprocedure Evaluation (Signed)
Anesthesia Evaluation  Patient identified by MRN, date of birth, ID band Patient awake    Reviewed: Allergy & Precautions, H&P , NPO status , Patient's Chart, lab work & pertinent test results  Airway Mallampati: I TM Distance: >3 FB Neck ROM: Full    Dental   Pulmonary former smoker,          Cardiovascular hypertension, Pt. on medications     Neuro/Psych    GI/Hepatic   Endo/Other    Renal/GU      Musculoskeletal   Abdominal   Peds  Hematology   Anesthesia Other Findings   Reproductive/Obstetrics                           Anesthesia Physical Anesthesia Plan  ASA: II  Anesthesia Plan: General   Post-op Pain Management:    Induction: Intravenous  Airway Management Planned: Oral ETT  Additional Equipment:   Intra-op Plan:   Post-operative Plan: Extubation in OR  Informed Consent: I have reviewed the patients History and Physical, chart, labs and discussed the procedure including the risks, benefits and alternatives for the proposed anesthesia with the patient or authorized representative who has indicated his/her understanding and acceptance.     Plan Discussed with: CRNA and Surgeon  Anesthesia Plan Comments:         Anesthesia Quick Evaluation

## 2014-06-16 ENCOUNTER — Encounter (HOSPITAL_COMMUNITY): Payer: Self-pay | Admitting: Neurosurgery

## 2014-06-16 NOTE — Progress Notes (Signed)
Physical Therapy Evaluation Patient Details Name: Kyle Greene MRN: [id number] DOB: 1941-01-30 Today's Date: 06/16/2014   History of Present Illness  Kyle Greene is a 73 y.o. Male s/p lumbar laminectomy/decompression microdiscectomy 2 levels on 06/15/14. PMHx of gout, high BP, and back surgery x2.   Clinical Impression  Pt mobilizing well with supervision. Pt well enough to go home with support of spouse supervision at discharge.  PT to follow acutely for deficits listed below.          Equipment Recommendations  None recommended by PT       Precautions / Restrictions Precautions Precautions: Back;Fall Precaution Booklet Issued: Yes (comment) Precaution Comments: Educated on precautions for safety purposes Restrictions Weight Bearing Restrictions: No      Mobility   Transfers Overall transfer level: Needs assistance Equipment used: None Transfers: Sit to/from Stand Sit to Stand: Supervision         General transfer comment: Pt went from sit to stand without walker but then grabbed onto walker once standing  Ambulation/Gait Ambulation/Gait assistance: Min guard Ambulation Distance (Feet): 175 Feet Assistive device: Rolling walker (2 wheeled) Gait Pattern/deviations: Step-through pattern Gait velocity: Slowed when walked back to room without walker   General Gait Details: Walked to stairs with walker. Completed stairs and walked back to room without walker.  Stairs Stairs: Yes Stairs assistance: Min guard Stair Management: One rail Right;Alternating pattern;Step to pattern;Forwards Number of Stairs: 7 General stair comments: Walked up the stairs with step to pattern for the first few steps then switched to alternating pattern when both going up and down the stairs.      Balance Overall balance assessment: Modified Independent (First used walker but progressed to walking without it)                                           Pertinent  Vitals/Pain See vitals flow sheet.     Home Living Family/patient expects to be discharged to:: Private residence Living Arrangements: Spouse/significant other Available Help at Discharge: Family;Available 24 hours/day Type of Home: House Home Access: Stairs to enter Entrance Stairs-Rails: Right Entrance Stairs-Number of Steps: 5 Home Layout: One level;Other (Comment) Home Equipment: None      Prior Function Level of Independence: Independent         Comments: pt was driving, working Tax inspector)     Journalist, newspaper   Dominant Hand: Right    Extremity/Trunk Assessment               Lower Extremity Assessment: Overall WFL for tasks assessed         Communication   Communication: No difficulties  Cognition Arousal/Alertness: Awake/alert Behavior During Therapy: WFL for tasks assessed/performed Overall Cognitive Status: Within Functional Limits for tasks assessed       Memory: Decreased recall of precautions                       PT Goals (Current goals can be found in the Care Plan section) Acute Rehab PT Goals PT Goal Formulation: With patient Time For Goal Achievement: 06/30/14 Potential to Achieve Goals: Good     End of Session Equipment Utilized During Treatment: Gait belt Activity Tolerance: Patient tolerated treatment well Patient left: in chair;with call bell/phone within reach;with chair alarm set           Time: 2979-8921 PT Time  Calculation (min): 20 min   Charges:   PT Evaluation $Initial PT Evaluation Tier I: 1 Procedure PT Treatments $Gait Training: 8-22 mins   PT G CodesEber Jones, [place] [phone]

## 2014-06-16 NOTE — Progress Notes (Signed)
CARE MANAGEMENT NOTE 06/16/2014  Patient:  Kyle Greene, Kyle Greene   Account Number:  [id number]  Date Initiated:  06/16/2014  Documentation initiated by:  Olga Coaster  Subjective/Objective Assessment:   ADMITTED FOR SURGERY     Action/Plan:   CM FOLLOWING FOR DCP   Anticipated DC Date:  06/21/2014   Anticipated DC Plan:  AWAITING FOR PT/OT EVALS FOR DISPOSITION NEEDS     DC Planning Services  CM consult             Status of service:  In process, will continue to follow Medicare Important Message given?   (If response is "NO", the following Medicare IM given date fields will be blank)  Per UR Regulation:  Reviewed for med. necessity/level of care/duration of stay  Comments:  6/25/2015Mindi Slicker RN,BSN,MHA [phone]

## 2014-06-16 NOTE — Progress Notes (Signed)
Read, reviewed, edited and agree with student's findings and recommendations.  Rebecca B. Medendorp, PT, DPT #[phone]  

## 2014-06-16 NOTE — Progress Notes (Signed)
Patient ID: Kyle Greene, male   DOB: 1941-09-13, 73 y.o.   MRN: [id number] Doing well. No pain as preop. Some oozing from wound. Dressing changed. Continue pt/ot

## 2014-06-16 NOTE — Progress Notes (Signed)
Occupational Therapy Evaluation Patient Details Name: Kyle Greene MRN: [id number] DOB: 1941/07/24 Today's Date: 06/16/2014    History of Present Illness Kyle Greene is a 73 y.o. Male s/p lumbar laminectomy/decompression microdiscectomy 2 levels on 06/15/14.    Clinical Impression   PTA pt lived at home with his wife and was independent with ADLs and functional mobility. Pt overall at Supervision level for functional mobility and requires assistance for LB ADLs and toilet hygiene due to back precautions. Pt would benefit from skilled OT to increase independence prior to d/c home.     Follow Up Recommendations  No OT follow up;Supervision/Assistance - 24 hour    Equipment Recommendations  None recommended by OT       Precautions / Restrictions Precautions Precautions: Back;Fall Precaution Booklet Issued: Yes (comment) Precaution Comments: Educated pt on 3/3 back precautions and incorporating into ADLs.  Restrictions Weight Bearing Restrictions: No      Mobility Bed Mobility Overal bed mobility: Needs Assistance Bed Mobility: Rolling;Sidelying to Sit Rolling: Supervision Sidelying to sit: Supervision       General bed mobility comments: Pt able to log roll and sidelie>sit with Supervision for back precautions. Pt moved a bit quickly and encouraged pt to pause before standing.   Transfers Overall transfer level: Needs assistance Equipment used: None Transfers: Sit to/from Stand Sit to Stand: Supervision         General transfer comment: Pt with good hand placement and overall supervision for transfers.          ADL Overall ADL's : Needs assistance/impaired Eating/Feeding: Independent;Sitting   Grooming: Min guard;Standing;Oral care;Wash/dry face;Wash/dry hands   Upper Body Bathing: Set up;Sitting   Lower Body Bathing: Minimal assistance;Sit to/from stand;Adhering to back precautions   Upper Body Dressing : Set up;Sitting   Lower Body Dressing:  Moderate assistance;Sit to/from stand;Adhering to back precautions   Toilet Transfer: Min guard;Ambulation   Toileting- Clothing Manipulation and Hygiene: Minimal assistance;Sit to/from stand;Adhering to back precautions   Tub/ Shower Transfer: Tub transfer;Min guard;Ambulation   Functional mobility during ADLs: Min guard General ADL Comments: Pt ambulated around room and performed toileting and grooming while standing at sink. Pt participated in therapy session and educated on incorporating back precautions into ADLs. Pt appeared unaware of back precautions. Pt reports his wife will be home to assist.      Vision  Pt reports no change from baseline.  No apparent visual deficits.                  Perception Perception Perception Tested?: No   Praxis Praxis Praxis tested?: Within functional limits    Pertinent Vitals/Pain NAD; pt reports mild soreness in surgical site.      Hand Dominance Right   Extremity/Trunk Assessment Upper Extremity Assessment Upper Extremity Assessment: Overall WFL for tasks assessed   Lower Extremity Assessment Lower Extremity Assessment: Defer to PT evaluation   Cervical / Trunk Assessment Cervical / Trunk Assessment: Normal   Communication Communication Communication: No difficulties   Cognition Arousal/Alertness: Awake/alert Behavior During Therapy: WFL for tasks assessed/performed Overall Cognitive Status: Within Functional Limits for tasks assessed       Memory: Decreased recall of precautions                        Home Living Family/patient expects to be discharged to:: Private residence Living Arrangements: Spouse/significant other Available Help at Discharge: Family;Available 24 hours/day Type of Home: House Home Access: Stairs to enter (front  porch) Entrance Stairs-Number of Steps: 5 Entrance Stairs-Rails: Right (going up stairs) Home Layout: One level;Other (Comment) (basement with garage and washer/dryer; can  enter via front p)     Bathroom Shower/Tub: Tub/shower unit Shower/tub characteristics: Architectural technologist: Standard     Home Equipment: None          Prior Functioning/Environment Level of Independence: Independent        Comments: pt was driving, working Tax inspector)    OT Diagnosis: Generalized weakness;Acute pain   OT Problem List: Decreased strength;Decreased range of motion;Decreased activity tolerance;Decreased safety awareness;Decreased knowledge of use of DME or AE;Decreased knowledge of precautions   OT Treatment/Interventions: Self-care/ADL training;Energy conservation;DME and/or AE instruction;Patient/family education    OT Goals(Current goals can be found in the care plan section) Acute Rehab OT Goals Patient Stated Goal: to go home today OT Goal Formulation: With patient Time For Goal Achievement: 06/23/14 Potential to Achieve Goals: Good ADL Goals Pt Will Perform Grooming: with modified independence;standing Pt Will Perform Lower Body Bathing: with modified independence;with adaptive equipment;sit to/from stand Pt Will Perform Lower Body Dressing: with modified independence;with adaptive equipment;sit to/from stand Pt Will Transfer to Toilet: with modified independence;ambulating;regular height toilet Pt Will Perform Tub/Shower Transfer: Tub transfer;with modified independence;ambulating  OT Frequency: Min 2X/week    End of Session Equipment Utilized During Treatment: Gait belt Nurse Communication: Mobility status  Activity Tolerance: Patient tolerated treatment well Patient left: in chair;with call bell/phone within reach   Time: 0902-0935 OT Time Calculation (min): 33 min Charges:  OT General Charges $OT Visit: 1 Procedure OT Evaluation $Initial OT Evaluation Tier I: 1 Procedure OT Treatments $Self Care/Home Management : 23-37 mins  Juluis Rainier [phone] 06/16/2014, 10:28 AM

## 2014-06-16 NOTE — Op Note (Signed)
NAME:  Kyle Greene, Kyle Greene NO.:  [id number]  MEDICAL RECORD NO.:  [id number]  LOCATION:  [id number]                        FACILITY:  [facility]  PHYSICIAN:  Leeroy Cha, M.D.   DATE OF BIRTH:  12-09-1941  DATE OF PROCEDURE:  06/15/2014 DATE OF DISCHARGE:                              OPERATIVE REPORT   PREOPERATIVE DIAGNOSES: 1. Right L3-L4 and L4-L5 herniated disk. 2. Spondylosis. 3. 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  POSTOPERATIVE DIAGNOSES: 1. Right L3-L4 and L4-L5 herniated disk. 2. Spondylosis. 3. 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  PROCEDURE: 1. Right L3-L4 and L4-L5 diskectomy, 2. L4 laminotomy. 3. Foraminotomy to decompress the L3, L4, and L5 nerve roots. 4. Microscope.  SURGEON:  Leeroy Cha, M.D.  ASSISTANT:  Sherley Bounds, MD  CLINICAL HISTORY:  [name] is being admitted because of acute onset of right leg pain, which is not getting better with conservative treatment.  This gentleman about 25 years ago had a procedure on the right side.  He has 4 spaces in the lumbar by__________ MRI, we are talking about the second space from below as well as the third.  The patient knew the risk with the surgery as well as the benefits.  PROCEDURE IN DETAIL:  The patient was taken to the OR and after intubation, he was positioned in a prone manner.  The back was cleaned with DuraPrep.  Drapes were applied.  Midline incision following the previous one was made.  Muscle was retracted to the right side.  We took a x-ray, which showed that indeed we were right at the level of second and third space from below, which is L3-L4 and L4-L5.  Then, we brought the microscope into the area.  The patient had quite a bit of adhesion and lysis was accomplished using the microcurette.  With the drill, we removed the hemilamina of L4 on the right side and  _laminitomies_________ of L3 as well as L5.  We identified the L3 and L4 disk space.  The patient had adhesion.  Retraction was made.  We entered the disk space and large amount of degenerative disk, mostly medial and to the right were removed.  At the end, we had a good decompression for the L3 and L4 nerve roots.  At the level of L4-L5, the patient had quite a bit adhesion with the thecal sac being glued to the floor.  Lysis was accomplished, and we entered the disk space.  At this level, we found quite a bit of not only degenerative disk, but also a large osteophyte compromising the takeoff of the L5 nerve root.  Removal of the osteophytes with decompression of the L5 nerve root was achieved.  The area was irrigated.  Valsalva maneuver was negative.  Then, fentanyl and Depo-Medrol were left in the epidural space and the wound was closed with Vicryl and Steri-Strips.          ______________________________ Leeroy Cha, M.D.     EB/MEDQ  D:  06/15/2014  T:  06/15/2014  Job:  [id number]

## 2014-06-16 NOTE — Plan of Care (Signed)
Problem: Consults Goal: Diagnosis - Spinal Surgery Lumbar Laminectomy (Complex)     

## 2014-06-17 NOTE — Progress Notes (Signed)
Pt A&O x4; pt discharge education and instructions completed with pt and spouse at side. Both voices understanding and denies any pain. All lines including pt IV removed. Pt incision dsg changed by MD; clean dry and intact. Pt handed his prescription by MD and pt state "I have my prescription for pain and spasms". Pt transported off unit via wheelchair with belongings at side. Pt discharge home and spouse to transport pt to disposition. P.Amo Kuffour RN.

## 2014-06-17 NOTE — Discharge Summary (Signed)
Physician Discharge Summary  Patient ID: Kyle Greene MRN: [id number] DOB/AGE: [birth year] 72 y.o.  Admit date: 06/15/2014 Discharge date: 06/17/2014  Admission Diagnoses:lumbar herniated disc  Discharge Diagnoses:  Active Problems:   Lumbar herniated disc   Discharged Condition:no pain  Hospital Course: surgery  Consults: none  Significant Diagnostic Studies: myelogram  Treatments: discectomy l3-4, 4-5  Discharge Exam: Blood pressure 149/73, pulse 56, temperature 97.9 F (36.6 C), temperature source Oral, resp. rate 18, height 5\' 8"  (1.727 m), weight 160 lb (72.576 kg), SpO2 96.00%. No weakness  Disposition: home     Medication List    ASK your doctor about these medications       famotidine 20 MG tablet  Commonly known as:  PEPCID  Take 20 mg by mouth daily.     hydrochlorothiazide 12.5 MG capsule  Commonly known as:  MICROZIDE  Take 12.5 mg by mouth daily.     HYDROcodone-ibuprofen 7.5-200 MG per tablet  Commonly known as:  VICOPROFEN  Take 1 tablet by mouth every 8 (eight) hours as needed for moderate pain.     indomethacin 50 MG capsule  Commonly known as:  INDOCIN  Take 50 mg by mouth 2 (two) times daily as needed (gout pain).     oxyCODONE 15 MG immediate release tablet  Commonly known as:  ROXICODONE  Take 15 mg by mouth every other day as needed for pain.         Signed: Floyce Stakes 06/17/2014, 9:51 AM

## 2016-04-24 ENCOUNTER — Telehealth: Payer: Self-pay | Admitting: Neurology

## 2016-04-24 NOTE — Telephone Encounter (Signed)
Called pt to make appt. Last seen 05/03/14. LVM to call back .

## 2016-06-28 DIAGNOSIS — M1000 Idiopathic gout, unspecified site: Secondary | ICD-10-CM | POA: Diagnosis not present

## 2016-06-28 DIAGNOSIS — E785 Hyperlipidemia, unspecified: Secondary | ICD-10-CM | POA: Diagnosis not present

## 2016-06-28 DIAGNOSIS — I10 Essential (primary) hypertension: Secondary | ICD-10-CM | POA: Diagnosis not present

## 2016-07-05 DIAGNOSIS — I10 Essential (primary) hypertension: Secondary | ICD-10-CM | POA: Diagnosis not present

## 2016-07-05 DIAGNOSIS — M109 Gout, unspecified: Secondary | ICD-10-CM | POA: Diagnosis not present

## 2016-07-05 DIAGNOSIS — K210 Gastro-esophageal reflux disease with esophagitis: Secondary | ICD-10-CM | POA: Diagnosis not present

## 2016-07-05 DIAGNOSIS — K649 Unspecified hemorrhoids: Secondary | ICD-10-CM | POA: Diagnosis not present

## 2016-07-05 DIAGNOSIS — R1084 Generalized abdominal pain: Secondary | ICD-10-CM | POA: Diagnosis not present

## 2016-08-06 DIAGNOSIS — R1011 Right upper quadrant pain: Secondary | ICD-10-CM | POA: Diagnosis not present

## 2016-08-07 DIAGNOSIS — R160 Hepatomegaly, not elsewhere classified: Secondary | ICD-10-CM | POA: Diagnosis not present

## 2016-08-07 DIAGNOSIS — K760 Fatty (change of) liver, not elsewhere classified: Secondary | ICD-10-CM | POA: Diagnosis not present

## 2017-02-27 DIAGNOSIS — K649 Unspecified hemorrhoids: Secondary | ICD-10-CM | POA: Diagnosis not present

## 2017-02-27 DIAGNOSIS — I10 Essential (primary) hypertension: Secondary | ICD-10-CM | POA: Diagnosis not present

## 2017-02-27 DIAGNOSIS — M62838 Other muscle spasm: Secondary | ICD-10-CM | POA: Diagnosis not present

## 2017-02-27 DIAGNOSIS — M109 Gout, unspecified: Secondary | ICD-10-CM | POA: Diagnosis not present

## 2017-03-13 DIAGNOSIS — M25512 Pain in left shoulder: Secondary | ICD-10-CM | POA: Diagnosis not present

## 2017-03-13 DIAGNOSIS — S46819D Strain of other muscles, fascia and tendons at shoulder and upper arm level, unspecified arm, subsequent encounter: Secondary | ICD-10-CM | POA: Diagnosis not present

## 2017-04-11 DIAGNOSIS — M542 Cervicalgia: Secondary | ICD-10-CM | POA: Diagnosis not present

## 2017-04-18 DIAGNOSIS — M542 Cervicalgia: Secondary | ICD-10-CM | POA: Diagnosis not present

## 2017-04-25 DIAGNOSIS — M542 Cervicalgia: Secondary | ICD-10-CM | POA: Diagnosis not present

## 2017-05-02 DIAGNOSIS — M542 Cervicalgia: Secondary | ICD-10-CM | POA: Diagnosis not present

## 2017-06-04 DIAGNOSIS — M542 Cervicalgia: Secondary | ICD-10-CM | POA: Diagnosis not present

## 2017-08-01 DIAGNOSIS — M109 Gout, unspecified: Secondary | ICD-10-CM | POA: Diagnosis not present

## 2017-08-01 DIAGNOSIS — E785 Hyperlipidemia, unspecified: Secondary | ICD-10-CM | POA: Diagnosis not present

## 2017-08-15 DIAGNOSIS — I10 Essential (primary) hypertension: Secondary | ICD-10-CM | POA: Diagnosis not present

## 2017-08-15 DIAGNOSIS — M6208 Separation of muscle (nontraumatic), other site: Secondary | ICD-10-CM | POA: Diagnosis not present

## 2017-08-15 DIAGNOSIS — K210 Gastro-esophageal reflux disease with esophagitis: Secondary | ICD-10-CM | POA: Diagnosis not present

## 2017-08-15 DIAGNOSIS — M109 Gout, unspecified: Secondary | ICD-10-CM | POA: Diagnosis not present

## 2017-11-24 DIAGNOSIS — H2512 Age-related nuclear cataract, left eye: Secondary | ICD-10-CM | POA: Diagnosis not present

## 2017-11-24 DIAGNOSIS — H25811 Combined forms of age-related cataract, right eye: Secondary | ICD-10-CM | POA: Diagnosis not present

## 2017-11-24 DIAGNOSIS — H40013 Open angle with borderline findings, low risk, bilateral: Secondary | ICD-10-CM | POA: Diagnosis not present

## 2017-11-24 DIAGNOSIS — H179 Unspecified corneal scar and opacity: Secondary | ICD-10-CM | POA: Diagnosis not present

## 2017-12-12 DIAGNOSIS — H2512 Age-related nuclear cataract, left eye: Secondary | ICD-10-CM | POA: Diagnosis not present

## 2017-12-22 NOTE — Patient Instructions (Signed)
Your procedure is scheduled on: 01/05/2018  Report to [facility] at  640  AM.  Call this number if you have problems the morning of surgery: [phone]   Do not eat food or drink liquids :After Midnight.      Take these medicines the morning of surgery with A SIP OF WATER: allopurinol, pepcid, microzide, indocin.   Do not wear jewelry, make-up or nail polish.  Do not wear lotions, powders, or perfumes. You may wear deodorant.  Do not shave 48 hours prior to surgery.  Do not bring valuables to the hospital.  Contacts, dentures or bridgework may not be worn into surgery.  Leave suitcase in the car. After surgery it may be brought to your room.  For patients admitted to the hospital, checkout time is 11:00 AM the day of discharge.   Patients discharged the day of surgery will not be allowed to drive home.  :     Please read over the following fact sheets that you were given: Coughing and Deep Breathing, Surgical Site Infection Prevention, Anesthesia Post-op Instructions and Care and Recovery After Surgery    Cataract A cataract is a clouding of the lens of the eye. When a lens becomes cloudy, vision is reduced based on the degree and nature of the clouding. Many cataracts reduce vision to some degree. Some cataracts make people more near-sighted as they develop. Other cataracts increase glare. Cataracts that are ignored and become worse can sometimes look white. The white color can be seen through the pupil. CAUSES   Aging. However, cataracts may occur at any age, even in newborns.   Certain drugs.   Trauma to the eye.   Certain diseases such as diabetes.   Specific eye diseases such as chronic inflammation inside the eye or a sudden attack of a rare form of glaucoma.   Inherited or acquired medical problems.  SYMPTOMS   Gradual, progressive drop in vision in the affected eye.   Severe, rapid visual loss. This most often happens when trauma is the cause.  DIAGNOSIS  To detect a  cataract, an eye doctor examines the lens. Cataracts are best diagnosed with an exam of the eyes with the pupils enlarged (dilated) by drops.  TREATMENT  For an early cataract, vision may improve by using different eyeglasses or stronger lighting. If that does not help your vision, surgery is the only effective treatment. A cataract needs to be surgically removed when vision loss interferes with your everyday activities, such as driving, reading, or watching TV. A cataract may also have to be removed if it prevents examination or treatment of another eye problem. Surgery removes the cloudy lens and usually replaces it with a substitute lens (intraocular lens, IOL).  At a time when both you and your doctor agree, the cataract will be surgically removed. If you have cataracts in both eyes, only one is usually removed at a time. This allows the operated eye to heal and be out of danger from any possible problems after surgery (such as infection or poor wound healing). In rare cases, a cataract may be doing damage to your eye. In these cases, your caregiver may advise surgical removal right away. The vast majority of people who have cataract surgery have better vision afterward. HOME CARE INSTRUCTIONS  If you are not planning surgery, you may be asked to do the following:  Use different eyeglasses.   Use stronger or brighter lighting.   Ask your eye doctor about reducing your  medicine dose or changing medicines if it is thought that a medicine caused your cataract. Changing medicines does not make the cataract go away on its own.   Become familiar with your surroundings. Poor vision can lead to injury. Avoid bumping into things on the affected side. You are at a higher risk for tripping or falling.   Exercise extreme care when driving or operating machinery.   Wear sunglasses if you are sensitive to bright light or experiencing problems with glare.  SEEK IMMEDIATE MEDICAL CARE IF:   You have a  worsening or sudden vision loss.   You notice redness, swelling, or increasing pain in the eye.   You have a fever.  Document Released: 12/09/2005 Document Revised: 11/28/2011 Document Reviewed: 08/02/2011 [facility] Patient Information 2012 [place].PATIENT INSTRUCTIONS POST-ANESTHESIA  IMMEDIATELY FOLLOWING SURGERY:  Do not drive or operate machinery for the first twenty four hours after surgery.  Do not make any important decisions for twenty four hours after surgery or while taking narcotic pain medications or sedatives.  If you develop intractable nausea and vomiting or a severe headache please notify your doctor immediately.  FOLLOW-UP:  Please make an appointment with your surgeon as instructed. You do not need to follow up with anesthesia unless specifically instructed to do so.  WOUND CARE INSTRUCTIONS (if applicable):  Keep a dry clean dressing on the anesthesia/puncture wound site if there is drainage.  Once the wound has quit draining you may leave it open to air.  Generally you should leave the bandage intact for twenty four hours unless there is drainage.  If the epidural site drains for more than 36-48 hours please call the anesthesia department.  QUESTIONS?:  Please feel free to call your physician or the hospital operator if you have any questions, and they will be happy to assist you.

## 2017-12-26 ENCOUNTER — Other Ambulatory Visit: Payer: Self-pay

## 2017-12-26 ENCOUNTER — Encounter (HOSPITAL_COMMUNITY): Payer: Self-pay

## 2017-12-26 ENCOUNTER — Encounter (HOSPITAL_COMMUNITY)
Admission: RE | Admit: 2017-12-26 | Discharge: 2017-12-26 | Disposition: A | Discharge status: Home / Self Care | Payer: PPO | Source: Ambulatory Visit | Attending: Ophthalmology | Admitting: Ophthalmology

## 2017-12-26 DIAGNOSIS — Z01810 Encounter for preprocedural cardiovascular examination: Secondary | ICD-10-CM | POA: Insufficient documentation

## 2017-12-26 DIAGNOSIS — R9431 Abnormal electrocardiogram [ECG] [EKG]: Secondary | ICD-10-CM | POA: Diagnosis not present

## 2017-12-26 DIAGNOSIS — Z01818 Encounter for other preprocedural examination: Secondary | ICD-10-CM | POA: Insufficient documentation

## 2017-12-26 HISTORY — DX: Abdominal aortic aneurysm, without rupture, unspecified: I71.40

## 2017-12-26 HISTORY — DX: Abdominal aortic aneurysm, without rupture: I71.4

## 2017-12-26 LAB — BASIC METABOLIC PANEL
Anion gap: 9 (ref 5–15)
BUN: 21 mg/dL — ABNORMAL HIGH (ref 6–20)
CALCIUM: 9.3 mg/dL (ref 8.9–10.3)
CO2: 28 mmol/L (ref 22–32)
Chloride: 101 mmol/L (ref 101–111)
Creatinine, Ser: 1 mg/dL (ref 0.61–1.24)
GFR calc Af Amer: >60 mL/min (ref >60)
GFR calc non Af Amer: >60 mL/min (ref >60)
Glucose, Bld: 103 mg/dL — ABNORMAL HIGH (ref 65–99)
Potassium: 3.9 mmol/L (ref 3.5–5.1)
SODIUM: 138 mmol/L (ref 135–145)

## 2017-12-26 LAB — CBC WITH DIFFERENTIAL/PLATELET
Basophils Absolute: 0.1 10*3/uL (ref 0.0–0.1)
Basophils Relative: 1 %
EOS ABS: 1 10*3/uL — AB (ref 0.0–0.7)
EOS PCT: 12 %
HCT: 46.4 % (ref 39.0–52.0)
Hemoglobin: 15.2 g/dL (ref 13.0–17.0)
Lymphocytes Relative: 25 %
Lymphs Abs: 2 10*3/uL (ref 0.7–4.0)
MCH: 30.9 pg (ref 26.0–34.0)
MCHC: 32.8 g/dL (ref 30.0–36.0)
MCV: 94.3 fL (ref 78.0–100.0)
MONO ABS: 0.8 10*3/uL (ref 0.1–1.0)
MONOS PCT: 9 %
Neutro Abs: 4.3 10*3/uL (ref 1.7–7.7)
Neutrophils Relative %: 53 %
Platelets: 247 10*3/uL (ref 150–400)
RBC: 4.92 MIL/uL (ref 4.22–5.81)
RDW: 13.4 % (ref 11.5–15.5)
WBC: 8.2 10*3/uL (ref 4.0–10.5)

## 2018-01-05 ENCOUNTER — Ambulatory Visit (HOSPITAL_COMMUNITY): Payer: PPO | Admitting: Anesthesiology

## 2018-01-05 ENCOUNTER — Encounter (HOSPITAL_COMMUNITY): Payer: Self-pay | Admitting: *Deleted

## 2018-01-05 ENCOUNTER — Encounter (HOSPITAL_COMMUNITY)
Admission: RE | Disposition: A | Discharge status: Home / Self Care | Payer: Self-pay | Source: Ambulatory Visit | Attending: Ophthalmology

## 2018-01-05 ENCOUNTER — Ambulatory Visit (HOSPITAL_COMMUNITY)
Admission: RE | Admit: 2018-01-05 | Discharge: 2018-01-05 | Disposition: A | Discharge status: Home / Self Care | Payer: PPO | Source: Ambulatory Visit | Attending: Ophthalmology | Admitting: Ophthalmology

## 2018-01-05 DIAGNOSIS — M109 Gout, unspecified: Secondary | ICD-10-CM | POA: Diagnosis not present

## 2018-01-05 DIAGNOSIS — I10 Essential (primary) hypertension: Secondary | ICD-10-CM | POA: Diagnosis not present

## 2018-01-05 DIAGNOSIS — H2512 Age-related nuclear cataract, left eye: Secondary | ICD-10-CM | POA: Insufficient documentation

## 2018-01-05 DIAGNOSIS — Z79899 Other long term (current) drug therapy: Secondary | ICD-10-CM | POA: Insufficient documentation

## 2018-01-05 DIAGNOSIS — K219 Gastro-esophageal reflux disease without esophagitis: Secondary | ICD-10-CM | POA: Insufficient documentation

## 2018-01-05 HISTORY — PX: CATARACT EXTRACTION W/PHACO: SHX586

## 2018-01-05 SURGERY — PHACOEMULSIFICATION, CATARACT, WITH IOL INSERTION
Anesthesia: Monitor Anesthesia Care | Site: Eye | Laterality: Left

## 2018-01-05 MED ORDER — POVIDONE-IODINE 5 % OP SOLN
OPHTHALMIC | Status: DC | PRN
  Administered 2018-01-05: 1 via OPHTHALMIC

## 2018-01-05 MED ORDER — MIDAZOLAM HCL 2 MG/2ML IJ SOLN
1.0000 mg | INTRAMUSCULAR | Status: AC
  Administered 2018-01-05: 2 mg via INTRAVENOUS

## 2018-01-05 MED ORDER — FENTANYL CITRATE (PF) 100 MCG/2ML IJ SOLN
25.0000 ug | Freq: Once | INTRAMUSCULAR | Status: AC
  Administered 2018-01-05: 25 ug via INTRAVENOUS

## 2018-01-05 MED ORDER — PROVISC 10 MG/ML IO SOLN
INTRAOCULAR | Status: DC | PRN
Start: 2018-01-05 — End: 2018-01-05
  Administered 2018-01-05: 0.85 mL via INTRAOCULAR

## 2018-01-05 MED ORDER — LACTATED RINGERS IV SOLN
INTRAVENOUS | Status: DC
  Administered 2018-01-05: 1000 mL via INTRAVENOUS

## 2018-01-05 MED ORDER — PHENYLEPHRINE HCL 2.5 % OP SOLN
1.0000 [drp] | OPHTHALMIC | Status: AC
  Administered 2018-01-05 (×3): 1 [drp] via OPHTHALMIC

## 2018-01-05 MED ORDER — TETRACAINE HCL 0.5 % OP SOLN
1.0000 [drp] | OPHTHALMIC | Status: AC
  Administered 2018-01-05 (×3): 1 [drp] via OPHTHALMIC

## 2018-01-05 MED ORDER — BSS IO SOLN
INTRAOCULAR | Status: DC | PRN
  Administered 2018-01-05: 15 mL

## 2018-01-05 MED ORDER — LIDOCAINE HCL (PF) 1 % IJ SOLN
INTRAMUSCULAR | Status: DC | PRN
  Administered 2018-01-05: .6 mL

## 2018-01-05 MED ORDER — FENTANYL CITRATE (PF) 100 MCG/2ML IJ SOLN
INTRAMUSCULAR | Status: AC
Start: 2018-01-05 — End: 2018-01-05
  Filled 2018-01-05: qty 2

## 2018-01-05 MED ORDER — NEOMYCIN-POLYMYXIN-DEXAMETH 3.5-10000-0.1 OP SUSP
OPHTHALMIC | Status: DC | PRN
  Administered 2018-01-05: 2 [drp] via OPHTHALMIC

## 2018-01-05 MED ORDER — EPINEPHRINE PF 1 MG/ML IJ SOLN
INTRAOCULAR | Status: DC | PRN
  Administered 2018-01-05: 500 mL

## 2018-01-05 MED ORDER — LIDOCAINE HCL 3.5 % OP GEL
1.0000 "application " | Freq: Once | OPHTHALMIC | Status: AC
  Administered 2018-01-05: 1 via OPHTHALMIC

## 2018-01-05 MED ORDER — CYCLOPENTOLATE-PHENYLEPHRINE 0.2-1 % OP SOLN
1.0000 [drp] | OPHTHALMIC | Status: AC
  Administered 2018-01-05 (×3): 1 [drp] via OPHTHALMIC

## 2018-01-05 MED ORDER — MIDAZOLAM HCL 2 MG/2ML IJ SOLN
INTRAMUSCULAR | Status: AC
  Filled 2018-01-05: qty 2

## 2018-01-05 SURGICAL SUPPLY — 10 items
CLOTH BEACON ORANGE TIMEOUT ST (SAFETY) ×1 IMPLANT
EYE SHIELD UNIVERSAL CLEAR (GAUZE/BANDAGES/DRESSINGS) ×1 IMPLANT
GLOVE BIOGEL PI IND STRL 7.0 (GLOVE) IMPLANT
GLOVE BIOGEL PI INDICATOR 7.0 (GLOVE) ×2
LENS ALC ACRYL/TECN (Ophthalmic Related) ×1 IMPLANT
PAD ARMBOARD 7.5X6 YLW CONV (MISCELLANEOUS) ×1 IMPLANT
SYRINGE LUER LOK 1CC (MISCELLANEOUS) ×1 IMPLANT
TAPE SURG TRANSPORE 1 IN (GAUZE/BANDAGES/DRESSINGS) IMPLANT
TAPE SURGICAL TRANSPORE 1 IN (GAUZE/BANDAGES/DRESSINGS) ×1
WATER STERILE IRR 250ML POUR (IV SOLUTION) ×1 IMPLANT

## 2018-01-05 NOTE — Anesthesia Preprocedure Evaluation (Signed)
Anesthesia Evaluation  Patient identified by MRN, date of birth, ID band Patient awake    Reviewed: Allergy & Precautions, H&P , NPO status , Patient's Chart, lab work & pertinent test results  Airway Mallampati: I  TM Distance: >3 FB Neck ROM: Full    Dental  (+) Teeth Intact, Poor Dentition   Pulmonary former smoker,    breath sounds clear to auscultation       Cardiovascular hypertension, Pt. on medications + Peripheral Vascular Disease   Rhythm:Regular Rate:Normal     Neuro/Psych  Neuromuscular disease    GI/Hepatic   Endo/Other    Renal/GU      Musculoskeletal   Abdominal   Peds  Hematology   Anesthesia Other Findings   Reproductive/Obstetrics                             Anesthesia Physical Anesthesia Plan  ASA: III  Anesthesia Plan: MAC   Post-op Pain Management:    Induction: Intravenous  PONV Risk Score and Plan:   Airway Management Planned: Nasal Cannula  Additional Equipment:   Intra-op Plan:   Post-operative Plan:   Informed Consent: I have reviewed the patients History and Physical, chart, labs and discussed the procedure including the risks, benefits and alternatives for the proposed anesthesia with the patient or authorized representative who has indicated his/her understanding and acceptance.     Plan Discussed with:   Anesthesia Plan Comments:         Anesthesia Quick Evaluation

## 2018-01-05 NOTE — Op Note (Signed)
Date of Admission: 01/05/2018  Date of Surgery: 01/05/2018  Pre-Op Dx: Cataract Left  Eye  Post-Op Dx: Senile Nuclear Cataract  Left  Eye,  Dx Code H25.12  Surgeon: Tonny Branch, M.D.  Assistants: None  Anesthesia: Topical with MAC  Indications: Painless, progressive loss of vision with compromise of daily activities.  Surgery: Cataract Extraction with Intraocular lens Implant Left Eye  Discription: The patient had dilating drops and viscous lidocaine placed into the Left eye in the pre-op holding area. After transfer to the operating room, a time out was performed. The patient was then prepped and draped. Beginning with a 60m blade a paracentesis port was made at the surgeon's 2 o'clock position. The anterior chamber was then filled with 1% non-preserved lidocaine. This was followed by filling the anterior chamber with Provisc.  A 2.471mkeratome blade was used to make a clear corneal incision at the temporal limbus.  A bent cystatome needle was used to create a continuous tear capsulotomy. Hydrodissection was performed with balanced salt solution on a Fine canula. The lens nucleus was then removed using the phacoemulsification handpiece. Residual cortex was removed with the I&A handpiece. The anterior chamber and capsular bag were refilled with Provisc. A posterior chamber intraocular lens was placed into the capsular bag with it's injector. The implant was positioned with the Kuglan hook. The Provisc was then removed from the anterior chamber and capsular bag with the I&A handpiece. Stromal hydration of the main incision and paracentesis port was performed with BSS on a Fine canula. The wounds were tested for leak which was negative. The patient tolerated the procedure well. There were no operative complications. The patient was then transferred to the recovery room in stable condition.  Complications: None  Specimen: None  EBL: None  Prosthetic device: J[employer]J Technis, PCB00, power 19.5, SN  [id number]

## 2018-01-05 NOTE — Anesthesia Postprocedure Evaluation (Signed)
Anesthesia Post Note  Patient: AUGUSTO DECKMAN  Procedure(s) Performed: CATARACT EXTRACTION PHACO AND INTRAOCULAR LENS PLACEMENT ([place]) (Left Eye)  Patient location during evaluation: Short Stay Anesthesia Type: MAC Level of consciousness: awake and alert and patient cooperative Pain management: satisfactory to patient Vital Signs Assessment: post-procedure vital signs reviewed and stable Respiratory status: spontaneous breathing Cardiovascular status: stable Postop Assessment: no apparent nausea or vomiting Anesthetic complications: no     Last Vitals:  Vitals:   01/05/18 0720 01/05/18 0725  BP: (!) 155/94 134/77  Pulse:    Resp: 14 17  Temp:    SpO2: 99% 95%    Last Pain:  Vitals:   01/05/18 0705  TempSrc: Oral                 Quinnten Calvin

## 2018-01-05 NOTE — Transfer of Care (Addendum)
Immediate Anesthesia Transfer of Care Note  Patient: Kyle Greene  Procedure(s) Performed: CATARACT EXTRACTION PHACO AND INTRAOCULAR LENS PLACEMENT ([facility]) (Left Eye)  Patient Location: Short stay  Anesthesia Type:MAC  Level of Consciousness: awake, alert  and patient cooperative  Airway & Oxygen Therapy: Patient Spontanous Breathing  Post-op Assessment: Report given to RN and Post -op Vital signs reviewed and stable  Post vital signs: Reviewed and stable  Last Vitals:  Vitals:   01/05/18 0720 01/05/18 0725  BP: (!) 155/94 134/77  Pulse:    Resp: 14 17  Temp:    SpO2: 99% 95%    Last Pain:  Vitals:   01/05/18 0705  TempSrc: Oral         Complications: No apparent anesthesia complications

## 2018-01-05 NOTE — Anesthesia Procedure Notes (Signed)
Procedure Name: MAC Date/Time: 01/05/2018 7:27 AM Performed by: Vista Deck, CRNA Pre-anesthesia Checklist: Patient identified, Emergency Drugs available, Suction available, Timeout performed and Patient being monitored Patient Re-evaluated:Patient Re-evaluated prior to induction Oxygen Delivery Method: Nasal Cannula

## 2018-01-05 NOTE — Discharge Instructions (Signed)

## 2018-01-05 NOTE — H&P (Signed)
I have reviewed the H&P, the patient was re-examined, and I have identified no interval changes in medical condition and plan of care since the history and physical of record  

## 2018-01-06 ENCOUNTER — Encounter (HOSPITAL_COMMUNITY): Payer: Self-pay | Admitting: Ophthalmology

## 2018-01-26 DIAGNOSIS — H25811 Combined forms of age-related cataract, right eye: Secondary | ICD-10-CM | POA: Diagnosis not present

## 2018-01-30 DIAGNOSIS — Z800 Family history of malignant neoplasm of digestive organs: Secondary | ICD-10-CM | POA: Diagnosis not present

## 2018-02-10 ENCOUNTER — Encounter (HOSPITAL_COMMUNITY)
Admission: RE | Admit: 2018-02-10 | Discharge: 2018-02-10 | Disposition: A | Discharge status: Home / Self Care | Payer: PPO | Source: Ambulatory Visit | Attending: Ophthalmology | Admitting: Ophthalmology

## 2018-02-10 ENCOUNTER — Encounter (HOSPITAL_COMMUNITY): Payer: Self-pay

## 2018-02-16 ENCOUNTER — Ambulatory Visit (HOSPITAL_COMMUNITY): Payer: PPO | Admitting: Anesthesiology

## 2018-02-16 ENCOUNTER — Encounter (HOSPITAL_COMMUNITY): Payer: Self-pay | Admitting: Anesthesiology

## 2018-02-16 ENCOUNTER — Ambulatory Visit (HOSPITAL_COMMUNITY)
Admission: RE | Admit: 2018-02-16 | Discharge: 2018-02-16 | Disposition: A | Discharge status: Home / Self Care | Payer: PPO | Source: Ambulatory Visit | Attending: Ophthalmology | Admitting: Ophthalmology

## 2018-02-16 ENCOUNTER — Encounter (HOSPITAL_COMMUNITY)
Admission: RE | Disposition: A | Discharge status: Home / Self Care | Payer: Self-pay | Source: Ambulatory Visit | Attending: Ophthalmology

## 2018-02-16 DIAGNOSIS — M109 Gout, unspecified: Secondary | ICD-10-CM | POA: Diagnosis not present

## 2018-02-16 DIAGNOSIS — Z87891 Personal history of nicotine dependence: Secondary | ICD-10-CM | POA: Insufficient documentation

## 2018-02-16 DIAGNOSIS — H2511 Age-related nuclear cataract, right eye: Secondary | ICD-10-CM | POA: Diagnosis not present

## 2018-02-16 DIAGNOSIS — I10 Essential (primary) hypertension: Secondary | ICD-10-CM | POA: Diagnosis not present

## 2018-02-16 DIAGNOSIS — Z888 Allergy status to other drugs, medicaments and biological substances status: Secondary | ICD-10-CM | POA: Diagnosis not present

## 2018-02-16 DIAGNOSIS — Z79899 Other long term (current) drug therapy: Secondary | ICD-10-CM | POA: Diagnosis not present

## 2018-02-16 DIAGNOSIS — K219 Gastro-esophageal reflux disease without esophagitis: Secondary | ICD-10-CM | POA: Diagnosis not present

## 2018-02-16 DIAGNOSIS — H25811 Combined forms of age-related cataract, right eye: Secondary | ICD-10-CM | POA: Insufficient documentation

## 2018-02-16 HISTORY — PX: CATARACT EXTRACTION W/PHACO: SHX586

## 2018-02-16 SURGERY — PHACOEMULSIFICATION, CATARACT, WITH IOL INSERTION
Anesthesia: Monitor Anesthesia Care | Site: Eye | Laterality: Right

## 2018-02-16 MED ORDER — PHENYLEPHRINE HCL 2.5 % OP SOLN
1.0000 [drp] | OPHTHALMIC | Status: AC
  Administered 2018-02-16 (×3): 1 [drp] via OPHTHALMIC

## 2018-02-16 MED ORDER — PROVISC 10 MG/ML IO SOLN
INTRAOCULAR | Status: DC | PRN
  Administered 2018-02-16: 0.85 mL via INTRAOCULAR

## 2018-02-16 MED ORDER — BSS IO SOLN
INTRAOCULAR | Status: DC | PRN
  Administered 2018-02-16: 15 mL

## 2018-02-16 MED ORDER — NEOMYCIN-POLYMYXIN-DEXAMETH 3.5-10000-0.1 OP SUSP
OPHTHALMIC | Status: DC | PRN
  Administered 2018-02-16: 2 [drp] via OPHTHALMIC

## 2018-02-16 MED ORDER — MIDAZOLAM HCL 2 MG/2ML IJ SOLN
INTRAMUSCULAR | Status: AC
Start: 2018-02-16 — End: 2018-02-16
  Filled 2018-02-16: qty 2

## 2018-02-16 MED ORDER — POVIDONE-IODINE 5 % OP SOLN
OPHTHALMIC | Status: DC | PRN
  Administered 2018-02-16: 1 via OPHTHALMIC

## 2018-02-16 MED ORDER — LACTATED RINGERS IV SOLN
INTRAVENOUS | Status: DC
  Administered 2018-02-16: 12:00:00 via INTRAVENOUS

## 2018-02-16 MED ORDER — ONDANSETRON 4 MG PO TBDP
4.0000 mg | ORAL_TABLET | Freq: Once | ORAL | Status: AC
  Administered 2018-02-16: 4 mg via ORAL

## 2018-02-16 MED ORDER — LIDOCAINE HCL (PF) 1 % IJ SOLN
INTRAMUSCULAR | Status: DC | PRN
  Administered 2018-02-16: .4 mL

## 2018-02-16 MED ORDER — ONDANSETRON 4 MG PO TBDP
ORAL_TABLET | ORAL | Status: AC
  Filled 2018-02-16: qty 1

## 2018-02-16 MED ORDER — LIDOCAINE HCL 3.5 % OP GEL
1.0000 | Freq: Once | OPHTHALMIC | Status: AC
  Administered 2018-02-16: 1 via OPHTHALMIC

## 2018-02-16 MED ORDER — MIDAZOLAM HCL 2 MG/2ML IJ SOLN
1.0000 mg | Freq: Once | INTRAMUSCULAR | Status: AC | PRN
  Administered 2018-02-16: 2 mg via INTRAVENOUS

## 2018-02-16 MED ORDER — EPINEPHRINE PF 1 MG/ML IJ SOLN
INTRAMUSCULAR | Status: DC | PRN
  Administered 2018-02-16: 500 mL

## 2018-02-16 MED ORDER — TETRACAINE HCL 0.5 % OP SOLN
1.0000 [drp] | OPHTHALMIC | Status: AC
  Administered 2018-02-16 (×3): 1 [drp] via OPHTHALMIC

## 2018-02-16 MED ORDER — CYCLOPENTOLATE-PHENYLEPHRINE 0.2-1 % OP SOLN
1.0000 [drp] | OPHTHALMIC | Status: AC
Start: 2018-02-16 — End: 2018-02-16
  Administered 2018-02-16 (×3): 1 [drp] via OPHTHALMIC

## 2018-02-16 SURGICAL SUPPLY — 12 items
CLOTH BEACON ORANGE TIMEOUT ST (SAFETY) ×1 IMPLANT
EYE SHIELD UNIVERSAL CLEAR (GAUZE/BANDAGES/DRESSINGS) ×1 IMPLANT
GLOVE BIOGEL PI IND STRL 6.5 (GLOVE) IMPLANT
GLOVE BIOGEL PI IND STRL 7.0 (GLOVE) IMPLANT
GLOVE BIOGEL PI INDICATOR 6.5 (GLOVE) ×1
GLOVE BIOGEL PI INDICATOR 7.0 (GLOVE) ×1
LENS ALC ACRYL/TECN (Ophthalmic Related) ×1 IMPLANT
PAD ARMBOARD 7.5X6 YLW CONV (MISCELLANEOUS) ×1 IMPLANT
SYRINGE LUER LOK 1CC (MISCELLANEOUS) ×1 IMPLANT
TAPE SURG TRANSPORE 1 IN (GAUZE/BANDAGES/DRESSINGS) IMPLANT
TAPE SURGICAL TRANSPORE 1 IN (GAUZE/BANDAGES/DRESSINGS) ×1
WATER STERILE IRR 250ML POUR (IV SOLUTION) ×1 IMPLANT

## 2018-02-16 NOTE — Discharge Instructions (Signed)
PATIENT INSTRUCTIONS POST-ANESTHESIA  IMMEDIATELY FOLLOWING SURGERY:  Do not drive or operate machinery for the first twenty four hours after surgery.  Do not make any important decisions for twenty four hours after surgery or while taking narcotic pain medications or sedatives.  If you develop intractable nausea and vomiting or a severe headache please notify your doctor immediately.  FOLLOW-UP:  Please make an appointment with your surgeon as instructed. You do not need to follow up with anesthesia unless specifically instructed to do so.  WOUND CARE INSTRUCTIONS (if applicable):  Keep a dry clean dressing on the anesthesia/puncture wound site if there is drainage.  Once the wound has quit draining you may leave it open to air.  Generally you should leave the bandage intact for twenty four hours unless there is drainage.  If the epidural site drains for more than 36-48 hours please call the anesthesia department.  QUESTIONS?:  Please feel free to call your physician or the hospital operator if you have any questions, and they will be happy to assist you.       Cataract Surgery, Care After Refer to this sheet in the next few weeks. These instructions provide you with information about caring for yourself after your procedure. Your health care provider may also give you more specific instructions. Your treatment has been planned according to current medical practices, but problems sometimes occur. Call your health care provider if you have any problems or questions after your procedure. What can I expect after the procedure? After the procedure, it is common to have:  Itching.  Discomfort.  Fluid discharge.  Sensitivity to light and to touch.  Bruising.  Follow these instructions at home: [place] your eye every day for signs of infection. Watch for: ? Redness, swelling, or pain. ? Fluid, blood, or pus. ? Warmth. ? Bad smell. Activity  Avoid strenuous activities, such  as playing contact sports, for as long as told by your health care provider.  Do not drive or operate heavy machinery until your health care provider approves.  Do not bend or lift heavy objects. Bending increases pressure in the eye. You can walk, climb stairs, and do light household chores.  Ask your health care provider when you can return to work. If you work in a dusty environment, you may be advised to wear protective eyewear for a period of time. General instructions  Take or apply over-the-counter and prescription medicines only as told by your health care provider. This includes eye drops.  Do not touch or rub your eyes.  If you were given a protective shield, wear it as told by your health care provider. If you were not given a protective shield, wear sunglasses as told by your health care provider to protect your eyes.  Keep the area around your eye clean and dry. Avoid swimming or allowing water to hit you directly in the face while showering until told by your health care provider. Keep soap and shampoo out of your eyes.  Do not put a contact lens into the affected eye or eyes until your health care provider approves.  Keep all follow-up visits as told by your health care provider. This is important. Contact a health care provider if:   You have increased bruising around your eye.  You have pain that is not helped with medicine.  You have a fever.  You have redness, swelling, or pain in your eye.  You have fluid, blood, or pus coming from  your incision.  Your vision gets worse. Get help right away if:  You have sudden vision loss. This information is not intended to replace advice given to you by your health care provider. Make sure you discuss any questions you have with your health care provider. Document Released: 06/28/2005 Document Revised: 04/18/2016 Document Reviewed: 10/19/2015 Elsevier Interactive Patient Education  [employer].

## 2018-02-16 NOTE — Op Note (Signed)
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  Assistants: None  Anesthesia: Topical with MAC  Indications: Painless, progressive loss of vision with compromise of daily activities.  Surgery: Cataract Extraction with Intraocular lens Implant Right Eye  Discription: The patient had dilating drops and viscous lidocaine placed into the Right eye in the pre-op holding area. After transfer to the operating room, a time out was performed. The patient was then prepped and draped. Beginning with a 16m blade a paracentesis port was made at the surgeon's 2 o'clock position. The anterior chamber was then filled with 1% non-preserved lidocaine. This was followed by filling the anterior chamber with Provisc.  A 2.410mkeratome blade was used to make a clear corneal incision at the temporal limbus.  A bent cystatome needle was used to create a continuous tear capsulotomy. Hydrodissection was performed with balanced salt solution on a Fine canula. The lens nucleus was then removed using the phacoemulsification handpiece. Residual cortex was removed with the I&A handpiece. The anterior chamber and capsular bag were refilled with Provisc. A posterior chamber intraocular lens was placed into the capsular bag with it's injector. The implant was positioned with the Kuglan hook. The Provisc was then removed from the anterior chamber and capsular bag with the I&A handpiece. Stromal hydration of the main incision and paracentesis port was performed with BSS on a Fine canula. The wounds were tested for leak which was negative. The patient tolerated the procedure well. There were no operative complications. The patient was then transferred to the recovery room in stable condition.  Complications: None  Specimen: None  EBL: None  Prosthetic device: Abbott Technis, PCB00, power  19.0, SN [id number]

## 2018-02-16 NOTE — Anesthesia Postprocedure Evaluation (Signed)
Anesthesia Post Note  Patient: Kyle Greene  Procedure(s) Performed: CATARACT EXTRACTION PHACO AND INTRAOCULAR LENS PLACEMENT (IOC) (Right Eye)  Patient location during evaluation: Short Stay Anesthesia Type: MAC Level of consciousness: awake and alert, oriented and patient cooperative Pain management: pain level controlled Vital Signs Assessment: post-procedure vital signs reviewed and stable Respiratory status: spontaneous breathing and respiratory function stable Cardiovascular status: stable Postop Assessment: no apparent nausea or vomiting Anesthetic complications: no     Last Vitals:  Vitals:   02/16/18 1200 02/16/18 1215  BP: (!) 141/69   Pulse: (!) 58 (!) 59  Resp: 18 20  Temp:    SpO2: 94% 94%    Last Pain:  Vitals:   02/16/18 1150  TempSrc: Oral                 ADAMS, AMY A

## 2018-02-16 NOTE — H&P (Signed)
I have reviewed the H&P, the patient was re-examined, and I have identified no interval changes in medical condition and plan of care since the history and physical of record  

## 2018-02-16 NOTE — Anesthesia Procedure Notes (Signed)
Procedure Name: MAC Date/Time: 02/16/2018 12:31 PM Performed by: Andree Elk Keval Nam A, CRNA Pre-anesthesia Checklist: Patient identified, Timeout performed, Emergency Drugs available, Suction available and Patient being monitored Oxygen Delivery Method: Nasal cannula

## 2018-02-16 NOTE — Anesthesia Preprocedure Evaluation (Signed)
Anesthesia Evaluation  Patient identified by MRN, date of birth, ID band Patient awake    Airway   TM Distance: >3 FB     Dental  (+) Poor Dentition   Pulmonary former smoker,    Pulmonary exam normal        Cardiovascular Exercise Tolerance: Poor hypertension, Pt. on medications + Peripheral Vascular Disease  Normal cardiovascular exam Rhythm:Regular Rate:Normal  AN-2019 08:22:18 [place] System-AP-300 ROUTINE RECORD Normal sinus rhythm Rightward axis Nonspecific ST and T wave abnormality Abnormal ECG   Neuro/Psych    GI/Hepatic Neg liver ROS, GERD  Medicated and Controlled,  Endo/Other  negative endocrine ROS  Renal/GU      Musculoskeletal   Abdominal Normal abdominal exam  (+)   Peds  Hematology negative hematology ROS (+)   Anesthesia Other Findings   Reproductive/Obstetrics                             Anesthesia Physical Anesthesia Plan  ASA: III  Anesthesia Plan: MAC   Post-op Pain Management:    Induction:   PONV Risk Score and Plan:   Airway Management Planned:   Additional Equipment:   Intra-op Plan:   Post-operative Plan:   Informed Consent: I have reviewed the patients History and Physical, chart, labs and discussed the procedure including the risks, benefits and alternatives for the proposed anesthesia with the patient or authorized representative who has indicated his/her understanding and acceptance.   Dental advisory given  Plan Discussed with: CRNA  Anesthesia Plan Comments:         Anesthesia Quick Evaluation

## 2018-02-16 NOTE — Transfer of Care (Signed)
Immediate Anesthesia Transfer of Care Note  Patient: Kyle Greene  Procedure(s) Performed: CATARACT EXTRACTION PHACO AND INTRAOCULAR LENS PLACEMENT (IOC) (Right Eye)  Patient Location: Short Stay  Anesthesia Type:MAC  Level of Consciousness: awake, alert , oriented and patient cooperative  Airway & Oxygen Therapy: Patient Spontanous Breathing  Post-op Assessment: Report given to RN and Post -op Vital signs reviewed and stable  Post vital signs: Reviewed and stable  Last Vitals:  Vitals:   02/16/18 1200 02/16/18 1215  BP: (!) 141/69   Pulse: (!) 58 (!) 59  Resp: 18 20  Temp:    SpO2: 94% 94%    Last Pain:  Vitals:   02/16/18 1150  TempSrc: Oral      Patients Stated Pain Goal: 5 (99/23/41 [id number])  Complications: No apparent anesthesia complications

## 2018-02-17 ENCOUNTER — Encounter (HOSPITAL_COMMUNITY): Payer: Self-pay | Admitting: Ophthalmology

## 2018-03-09 DIAGNOSIS — Z961 Presence of intraocular lens: Secondary | ICD-10-CM | POA: Diagnosis not present

## 2018-04-09 DIAGNOSIS — H1712 Central corneal opacity, left eye: Secondary | ICD-10-CM | POA: Diagnosis not present

## 2018-05-29 DIAGNOSIS — L84 Corns and callosities: Secondary | ICD-10-CM | POA: Diagnosis not present

## 2018-05-29 DIAGNOSIS — K219 Gastro-esophageal reflux disease without esophagitis: Secondary | ICD-10-CM | POA: Diagnosis not present

## 2018-05-29 DIAGNOSIS — M109 Gout, unspecified: Secondary | ICD-10-CM | POA: Diagnosis not present

## 2018-05-29 DIAGNOSIS — I10 Essential (primary) hypertension: Secondary | ICD-10-CM | POA: Diagnosis not present

## 2018-05-29 DIAGNOSIS — Z0000 Encounter for general adult medical examination without abnormal findings: Secondary | ICD-10-CM | POA: Diagnosis not present

## 2018-06-19 DIAGNOSIS — M7521 Bicipital tendinitis, right shoulder: Secondary | ICD-10-CM | POA: Diagnosis not present

## 2018-07-16 DIAGNOSIS — M25511 Pain in right shoulder: Secondary | ICD-10-CM | POA: Diagnosis not present

## 2018-07-16 DIAGNOSIS — G8929 Other chronic pain: Secondary | ICD-10-CM | POA: Diagnosis not present

## 2018-07-22 ENCOUNTER — Encounter: Payer: Self-pay | Admitting: Sports Medicine

## 2018-07-22 ENCOUNTER — Ambulatory Visit: Payer: PPO | Admitting: Sports Medicine

## 2018-07-22 VITALS — BP 148/69 | Ht 68.0 in | Wt 170.0 lb

## 2018-07-22 DIAGNOSIS — G8929 Other chronic pain: Secondary | ICD-10-CM

## 2018-07-22 DIAGNOSIS — M7581 Other shoulder lesions, right shoulder: Secondary | ICD-10-CM

## 2018-07-22 DIAGNOSIS — M25511 Pain in right shoulder: Secondary | ICD-10-CM | POA: Diagnosis not present

## 2018-07-22 MED ORDER — NITROGLYCERIN 0.2 MG/HR TD PT24: MEDICATED_PATCH | TRANSDERMAL | 1 refills | Status: DC

## 2018-07-22 MED ORDER — MELOXICAM 15 MG PO TABS: 15.0000 mg | ORAL_TABLET | Freq: Every day | ORAL | 1 refills | Status: DC

## 2018-07-22 NOTE — Progress Notes (Signed)
HPI  CC: Right shoulder pain  Patient is a 77 year old male with history of gout and hypertension who presents today for right-sided shoulder pain.  States his shoulder pain started around 1 month ago.  He does not remember any specific trauma around that time.  He states he felt the pain mostly when he was doing overhead activity.  He states is gotten worse over the last month.  He states it bothers him a lot at nighttime and often wakes him up.  His primary care doctor, Dr. Kathryne Eriksson, 2 weeks ago and was given a corticosteroid injection.  He states he did not get much improvement at this injection.  Taken ibuprofen 200 to 400 mg every 2 to 3 days.  He states he got some relief of this.  He denies any numbness and tingling in his arm.  He denies any weakness in the arm. Past Injuries: No prior injuries to the area. Past Surgeries: No surgeries noted Smoking: Former smoker, quit 1997 Family Hx: Noncontributory  History, allergies, medications reviewed by myself at today's visit.  ROS: Per HPI; in addition no fever, no rash, no additional weakness, no additional numbness, no additional paresthesias, and no additional falls/injury.   Objective: BP (!) 148/69   Ht 5\' 8"  (1.727 m)   Wt 170 lb (77.1 kg)   BMI 25.85 kg/m  Gen: Right-Hand Dominant. NAD, well groomed, a/o x3, normal affect.  CV: Well-perfused. Warm.  Resp: Non-labored.  Neuro: Sensation intact throughout. No gross coordination deficits.  Gait: Nonpathologic posture  Right shoulder exam: No erythema, swelling, rash noted.  Tenderness palpation over the lateral anterior shoulder.  Patient with full range of motion both active and passive.  Pain with range of motion at the 120 degrees and forward flexion.  Pain with palpation after 120 degrees and abduction.  Strength 5 out of 5 throughout.  Strength in abduction limited due to pain.  Negative Neer's, negative Hawkin's, positive speeds, positive empty can, negative crossover  test negative apprehension test.   ULTRASOUND: Shoulder, right   - No obvious evidence of bony deformity or osteophyte development appreciated.  - Long head of the biceps tendon: No evidence of tendon thickening, calcification, subluxation, or tearing in short or long axis views. No edema or bullseye sign.  - Subscapularis tendon: complete visualization across the width of the insertion point yielded no evidence of tendon thickening, calcification, or tears in the long axis view.  - Supraspinatus tendon: complete visualization across the width of the insertion point yielded no evidence of tendon thickening in the long axis view. There was some distal calcifications noted in long axis view.  No evidence of bursal inflammation appreciated.  - Infraspinatus and teres minor tendons: visualization across the width of the insertion points yielded no evidence of tendon thickening, calcification, or tears in the long axis view.  [facility] Joint: No evidence of joint separation, collapse, or osteophyte development appreciated. Small effusion present.  IMPRESSION: findings consistent with rotator cuff tendinitis, involving supraspinatus.   Assessment and Plan: Right rotator cuff tendinitis, involving supraspinatus.  Right shoulder pain for the last month.  Likely involving the rotator cuff, particularly supraspinatus, given physical exam findings.  Will start patient with physical therapy of the right shoulder at this time.  Physical therapy to be completed in [place], [place] due to proximity for patient.  Home exercise provided for shoulder range of motion.  Will start patient on Nitropatch protocol at this time.  Mobic 15 mg once daily  for 30 days.  We will follow him up in 1 month.  If he has no improvement after that time, we will consider scheduling for repeat shoulder corticosteroid injection under ultrasound guidance.  If he has no improvement following that intervention, we will consider MRI for further evaluation  and possible surgical intervention.  [facility] 07/22/2018 1:28 PM

## 2018-07-22 NOTE — Patient Instructions (Signed)

## 2018-07-28 DIAGNOSIS — M7521 Bicipital tendinitis, right shoulder: Secondary | ICD-10-CM | POA: Diagnosis not present

## 2018-07-30 DIAGNOSIS — M799 Soft tissue disorder, unspecified: Secondary | ICD-10-CM | POA: Diagnosis not present

## 2018-07-30 DIAGNOSIS — M6281 Muscle weakness (generalized): Secondary | ICD-10-CM | POA: Diagnosis not present

## 2018-07-30 DIAGNOSIS — M25511 Pain in right shoulder: Secondary | ICD-10-CM | POA: Diagnosis not present

## 2018-07-30 DIAGNOSIS — M25611 Stiffness of right shoulder, not elsewhere classified: Secondary | ICD-10-CM | POA: Diagnosis not present

## 2018-08-04 DIAGNOSIS — M25511 Pain in right shoulder: Secondary | ICD-10-CM | POA: Diagnosis not present

## 2018-08-04 DIAGNOSIS — M25611 Stiffness of right shoulder, not elsewhere classified: Secondary | ICD-10-CM | POA: Diagnosis not present

## 2018-08-04 DIAGNOSIS — M6281 Muscle weakness (generalized): Secondary | ICD-10-CM | POA: Diagnosis not present

## 2018-08-04 DIAGNOSIS — M799 Soft tissue disorder, unspecified: Secondary | ICD-10-CM | POA: Diagnosis not present

## 2018-08-07 DIAGNOSIS — M25511 Pain in right shoulder: Secondary | ICD-10-CM | POA: Diagnosis not present

## 2018-08-07 DIAGNOSIS — M25611 Stiffness of right shoulder, not elsewhere classified: Secondary | ICD-10-CM | POA: Diagnosis not present

## 2018-08-07 DIAGNOSIS — M6281 Muscle weakness (generalized): Secondary | ICD-10-CM | POA: Diagnosis not present

## 2018-08-07 DIAGNOSIS — M799 Soft tissue disorder, unspecified: Secondary | ICD-10-CM | POA: Diagnosis not present

## 2018-08-11 DIAGNOSIS — M25611 Stiffness of right shoulder, not elsewhere classified: Secondary | ICD-10-CM | POA: Diagnosis not present

## 2018-08-11 DIAGNOSIS — M25511 Pain in right shoulder: Secondary | ICD-10-CM | POA: Diagnosis not present

## 2018-08-11 DIAGNOSIS — M799 Soft tissue disorder, unspecified: Secondary | ICD-10-CM | POA: Diagnosis not present

## 2018-08-11 DIAGNOSIS — M6281 Muscle weakness (generalized): Secondary | ICD-10-CM | POA: Diagnosis not present

## 2018-08-14 DIAGNOSIS — M25511 Pain in right shoulder: Secondary | ICD-10-CM | POA: Diagnosis not present

## 2018-08-14 DIAGNOSIS — M799 Soft tissue disorder, unspecified: Secondary | ICD-10-CM | POA: Diagnosis not present

## 2018-08-14 DIAGNOSIS — M25611 Stiffness of right shoulder, not elsewhere classified: Secondary | ICD-10-CM | POA: Diagnosis not present

## 2018-08-14 DIAGNOSIS — M6281 Muscle weakness (generalized): Secondary | ICD-10-CM | POA: Diagnosis not present

## 2018-08-18 DIAGNOSIS — M799 Soft tissue disorder, unspecified: Secondary | ICD-10-CM | POA: Diagnosis not present

## 2018-08-18 DIAGNOSIS — M25611 Stiffness of right shoulder, not elsewhere classified: Secondary | ICD-10-CM | POA: Diagnosis not present

## 2018-08-18 DIAGNOSIS — M25511 Pain in right shoulder: Secondary | ICD-10-CM | POA: Diagnosis not present

## 2018-08-18 DIAGNOSIS — M6281 Muscle weakness (generalized): Secondary | ICD-10-CM | POA: Diagnosis not present

## 2018-08-19 ENCOUNTER — Ambulatory Visit: Payer: PPO | Admitting: Sports Medicine

## 2018-08-19 ENCOUNTER — Encounter: Payer: Self-pay | Admitting: Sports Medicine

## 2018-08-19 ENCOUNTER — Ambulatory Visit
Admission: RE | Admit: 2018-08-19 | Discharge: 2018-08-19 | Disposition: A | Discharge status: Home / Self Care | Payer: PPO | Source: Ambulatory Visit | Attending: Sports Medicine | Admitting: Sports Medicine

## 2018-08-19 VITALS — BP 140/70 | Ht 68.0 in | Wt 170.0 lb

## 2018-08-19 DIAGNOSIS — M67911 Unspecified disorder of synovium and tendon, right shoulder: Secondary | ICD-10-CM

## 2018-08-19 DIAGNOSIS — M25511 Pain in right shoulder: Secondary | ICD-10-CM | POA: Diagnosis not present

## 2018-08-19 DIAGNOSIS — G8929 Other chronic pain: Secondary | ICD-10-CM

## 2018-08-19 DIAGNOSIS — M19011 Primary osteoarthritis, right shoulder: Secondary | ICD-10-CM | POA: Diagnosis not present

## 2018-08-19 MED ORDER — METHYLPREDNISOLONE ACETATE 40 MG/ML IJ SUSP
40.0000 mg | Freq: Once | INTRAMUSCULAR | Status: AC
  Administered 2018-08-19: 40 mg via INTRA_ARTICULAR

## 2018-08-19 NOTE — Patient Instructions (Signed)
You are seen today in clinic for right shoulder pain follow-up.  We believe this is due to inflammation in your rotator cuff.  We will continue physical therapy at this time.  Continue with ibuprofen as needed for pain relief.  We also gave you a subacromial injection to your shoulder today.  This will hopefully give you some pain relief when you are undergoing physical therapy.  We will also obtain x-rays today to assess the bones of your shoulder.  We will see for 6 weeks and follow-up.

## 2018-08-19 NOTE — Progress Notes (Signed)
   HPI  CC: Right shoulder pain  Kyle Greene presents today for follow-up of right shoulder pain.  He states since he was last seen, he has been doing physical therapy twice a week.  He was taking Mobic for the first week, but decided he would rather use ibuprofen.  He is been using ibuprofen since he was last seen.  He states this gives him some relief.  He said he use the Nitropatch for around 1 week, but stopped because he said he did like it was not working.  He did not have any headaches associated.  He does not have any weakness in his arm, but states he does have some pain with overhead activity still.  He does not have any decreased range of motion in his arm per patient.  He denies any numbness tingling the arm.  He has no new trauma or injury to the area.   See HPI and/or previous note for associated ROS.  Objective: BP 140/70   Ht 5\' 8"  (1.727 m)   Wt 170 lb (77.1 kg)   BMI 25.85 kg/m  Gen: Right-Hand Dominant. NAD, well groomed, a/o x3, normal affect.  CV: Well-perfused. Warm.  Resp: Non-labored.  Neuro: Sensation intact throughout. No gross coordination deficits.   Right shoulder exam: No erythema, warmth, swelling noted.  No tenderness to palpation of her shoulder.  Full range of motion throughout all testing.  No pain at upward arc of range of motion.  5 out of 5 strength throughout all testing.  Negative Hawkins, negative Neer's, negative speeds, negative empty can, negative crossover test, negative O'Brien's test.  Mild pain with empty can testing and crossover test.  Assessment and plan:  Right rotator cuff tendinopathy, involving supraspinatus.  INJECTION: Patient was given informed consent, signed copy in the chart. Appropriate time out was taken. Area prepped and draped in usual sterile fashion. 1 cc of methylprednisolone 40 mg/ml plus  3 cc of 1% lidocaine without epinephrine was injected into the right subacromial space using a posterolateral approach. The patient tolerated  the procedure well. There were no complications. Post procedure instructions were given.  Advised Mr. Thorton that the Nitropatch requires several months of treatment were to see effect.  He is agreeable to continue the Nitropatch at this time, barring any side effects.  We will also continue formal physical therapy with him at this time.  I gave him a subacromial injection to his right shoulder today to assist with this.  Continue with ibuprofen as needed for pain relief.  I will obtain a three-view x-ray of the shoulder at this time, to assess for bony abnormalities of the acromium versus osteoarthritis of the shoulder.  I will see him back in 6 weeks.  If he has no improvement at that time, we can consider getting an MRI of the shoulder for further evaluation.   Lewanda Rife, MD [facility] Fellow 08/19/2018 9:04 AM

## 2018-08-25 DIAGNOSIS — M25611 Stiffness of right shoulder, not elsewhere classified: Secondary | ICD-10-CM | POA: Diagnosis not present

## 2018-08-25 DIAGNOSIS — M799 Soft tissue disorder, unspecified: Secondary | ICD-10-CM | POA: Diagnosis not present

## 2018-08-25 DIAGNOSIS — M25511 Pain in right shoulder: Secondary | ICD-10-CM | POA: Diagnosis not present

## 2018-08-25 DIAGNOSIS — M6281 Muscle weakness (generalized): Secondary | ICD-10-CM | POA: Diagnosis not present

## 2018-08-28 DIAGNOSIS — M799 Soft tissue disorder, unspecified: Secondary | ICD-10-CM | POA: Diagnosis not present

## 2018-08-28 DIAGNOSIS — M25511 Pain in right shoulder: Secondary | ICD-10-CM | POA: Diagnosis not present

## 2018-08-28 DIAGNOSIS — M25611 Stiffness of right shoulder, not elsewhere classified: Secondary | ICD-10-CM | POA: Diagnosis not present

## 2018-08-28 DIAGNOSIS — M6281 Muscle weakness (generalized): Secondary | ICD-10-CM | POA: Diagnosis not present

## 2018-09-01 DIAGNOSIS — M25511 Pain in right shoulder: Secondary | ICD-10-CM | POA: Diagnosis not present

## 2018-09-01 DIAGNOSIS — M799 Soft tissue disorder, unspecified: Secondary | ICD-10-CM | POA: Diagnosis not present

## 2018-09-01 DIAGNOSIS — M6281 Muscle weakness (generalized): Secondary | ICD-10-CM | POA: Diagnosis not present

## 2018-09-01 DIAGNOSIS — M25611 Stiffness of right shoulder, not elsewhere classified: Secondary | ICD-10-CM | POA: Diagnosis not present

## 2018-09-04 DIAGNOSIS — M6281 Muscle weakness (generalized): Secondary | ICD-10-CM | POA: Diagnosis not present

## 2018-09-04 DIAGNOSIS — M25611 Stiffness of right shoulder, not elsewhere classified: Secondary | ICD-10-CM | POA: Diagnosis not present

## 2018-09-04 DIAGNOSIS — M25511 Pain in right shoulder: Secondary | ICD-10-CM | POA: Diagnosis not present

## 2018-09-04 DIAGNOSIS — M799 Soft tissue disorder, unspecified: Secondary | ICD-10-CM | POA: Diagnosis not present

## 2018-09-08 DIAGNOSIS — M6281 Muscle weakness (generalized): Secondary | ICD-10-CM | POA: Diagnosis not present

## 2018-09-08 DIAGNOSIS — M25511 Pain in right shoulder: Secondary | ICD-10-CM | POA: Diagnosis not present

## 2018-09-08 DIAGNOSIS — M25611 Stiffness of right shoulder, not elsewhere classified: Secondary | ICD-10-CM | POA: Diagnosis not present

## 2018-09-08 DIAGNOSIS — M799 Soft tissue disorder, unspecified: Secondary | ICD-10-CM | POA: Diagnosis not present

## 2018-09-23 ENCOUNTER — Ambulatory Visit: Payer: PPO | Admitting: Sports Medicine

## 2018-09-25 ENCOUNTER — Ambulatory Visit: Payer: PPO | Admitting: Sports Medicine

## 2018-09-25 ENCOUNTER — Encounter: Payer: Self-pay | Admitting: Sports Medicine

## 2018-09-25 VITALS — BP 146/70

## 2018-09-25 DIAGNOSIS — G8929 Other chronic pain: Secondary | ICD-10-CM | POA: Diagnosis not present

## 2018-09-25 DIAGNOSIS — M25512 Pain in left shoulder: Secondary | ICD-10-CM

## 2018-09-25 DIAGNOSIS — M25511 Pain in right shoulder: Secondary | ICD-10-CM

## 2018-09-25 NOTE — Progress Notes (Signed)
Kyle Greene - 77 y.o. male MRN [id number]  Date of birth: 1941/03/06    SUBJECTIVE:      Chief Complaint:/ HPI:  [name] is a 77 year old male who presents for follow-up for right shoulder pain.  Previously diagnosed with rotator cuff tendinopathy.  In attempting to treat his shoulder pain he has completed 6 weeks of physical therapy.  He had been using nitroglycerin patches, however was not fully compliant.  He has had 2 subacromial steroid injections which provided very limited relief.  He uses ibuprofen or Aleve as needed for pain.  His shoulder pain continues to be over the lateral shoulder.  Is worse with overhead movements.  It is painful to lay on his side and wakes him up at night.  He denies any bruising or erythema.  No focal weakness.  Patient also complains of intermittent, very mild anterior lateral left shoulder pain for a few days.   ROS:     See HPI  PERTINENT  PMH / PSH FH / / SH:  Past Medical, Surgical, Social, and Family History Reviewed & Updated in the EMR.    OBJECTIVE: BP (!) 146/70   Physical Exam:  Vital signs are reviewed.  GEN: Alert and oriented, NAD Pulm: Breathing unlabored PSY: normal mood, congruent affect  MSK: Right shoulder: No obvious deformity or asymmetry. No bruising. No swelling No TTP. Specifically no TTP over [facility] joint Full ROM in flexion, abduction, internal/external rotation NV intact distally Special Tests:  - Impingement: Pain with Hawkins and Neers.  - Supraspinatous: Pain with empty can.  - Infraspinatous/Teres: No pain with resisted external rotation.  5/5 strength - Subscapularis: Pain with belly press and bear hug.  5/5 strength - Biceps tendon: Negative Speeds.  - Labrum: Negative Obriens.  Ultrasound of Shoulder-Limited ultrasound of the right shoulder shows evidence of a 1.5 cm full-thickness supraspinatus tear.   Left shoulder: There is subtle shortening of the biceps muscle.  No erythema, swelling, bruising No TTP.  Specifically no TTP over AC joint Full ROM in flexion, abduction, internal/external rotation NV intact distally Special Tests:  - Impingement: Neg Hawkins and Neers.  - Supraspinatous: Negative empty can.  5/5 strength - Infraspinatous/Teres: No pain with resisted external rotation.  5/5 strength - Subscapularis: No pain with resisted internal rotation.  5/5 strength - Biceps tendon: Negative Speeds.  MSK [place]: Ultrasound of Shoulder-left shoulder  BT short: Biceps tendon was not visualized on short axis at the proximal aspect of the possible groove.  There is significant degenerative changes and calcifications at the bicipital groove.  The biceps tendon is visualized in short axis more distally. BT long: In long axis view, there is evidence of a biceps tendon tear.  The tendon is not completely retracted.  The tear corresponds with the lack of visualization in short axis. Supraspinatus tendon: Supraspinatus tendon appears intact without hypoechoic changes or calcifications Subscapularis tendon: appears intact without hypoechoic changes or calcifications Infraspinatus tendon: Appears intact without hypoechoic changes or calcifications  Summary and Additional findings-there appears to be a tear of the proximal biceps tendon.  Is likely degenerative in nature given the surrounding arthritic changes at the bicipital groove.  The tendon is not completely retracted.  The rotator cuff appears intact without focal tears      ASSESSMENT & PLAN:  1.  Right shoulder pain- patient has evidence of a full-thickness supraspinatus tear on ultrasound.  Thus far he has failed physical therapy, subacromial injections, and nitroglycerin treatment.  We briefly discussed  potential surgery.  At this time we will obtain an MRI to help further provide the patient with information in possible referral to orthopedic surgery.  We will follow-up after MRI  2.  Left shoulder pain- patient has tear of his proximal biceps  tendon.  Aside from some mild pain occasionally, there is no evidence of functional limitation on exam.  We will continue to monitor this.  He will continue ibuprofen or Aleve as needed for pain.

## 2018-09-25 NOTE — Patient Instructions (Signed)
Today we will order an MRI of your right shoulder to further evaluate your rotator cuff and help determine what might be causing your pain. Continue doing home exercises that you were taught in physical therapy Continue ibuprofen or Aleve as needed for pain Follow-up after your MRI is complete.

## 2018-10-02 ENCOUNTER — Ambulatory Visit
Admission: RE | Admit: 2018-10-02 | Discharge: 2018-10-02 | Disposition: A | Discharge status: Home / Self Care | Payer: PPO | Source: Ambulatory Visit | Attending: Sports Medicine | Admitting: Sports Medicine

## 2018-10-02 DIAGNOSIS — M25511 Pain in right shoulder: Principal | ICD-10-CM

## 2018-10-02 DIAGNOSIS — G8929 Other chronic pain: Secondary | ICD-10-CM

## 2018-10-09 ENCOUNTER — Encounter: Payer: Self-pay | Admitting: Sports Medicine

## 2018-10-09 ENCOUNTER — Ambulatory Visit (INDEPENDENT_AMBULATORY_CARE_PROVIDER_SITE_OTHER): Payer: PPO | Admitting: Sports Medicine

## 2018-10-09 DIAGNOSIS — M66311 Spontaneous rupture of flexor tendons, right shoulder: Secondary | ICD-10-CM | POA: Diagnosis not present

## 2018-10-09 DIAGNOSIS — M67912 Unspecified disorder of synovium and tendon, left shoulder: Secondary | ICD-10-CM

## 2018-10-09 DIAGNOSIS — M75101 Unspecified rotator cuff tear or rupture of right shoulder, not specified as traumatic: Secondary | ICD-10-CM

## 2018-10-09 MED ORDER — PREDNISONE 10 MG PO TABS: ORAL_TABLET | ORAL | 0 refills | Status: DC

## 2018-10-09 NOTE — Assessment & Plan Note (Addendum)
-   reviewed MRI results, complete supraspinatus tear with 2cm retraction and mild supraspinatus atrophy, complete long head biceps tear with retraction - Discussed patient would benefit from US guided glenohumeral injection, d/t not responding to the previous two subacromial injections, patient is hesitant to do this - With b/l sx, will place him on 12d Medrol dosepak. Counseled on common and severe side effects of medication, may take 1000mg  acetaminophen TID prn for breakthrough pain - f/u in 3 weeks, has to be on Friday as this is the patient's day off - He most likely will need routine injections q87mo if painful 1/2 subacromial, 1/2 US guided glenohumeral. - Failed PT, good ROM - reviewed MRI results with MWO, only beneficial surgery would be reverse total shoulder which is not recommended given his current ROM

## 2018-10-09 NOTE — Assessment & Plan Note (Signed)
-   [place] was performed last visit - exam today consistent with partial tearing vs complete tear of supraspinatus on LEFT - no acute injury - recommend similar nonoperative treatment measures for this, oral prednisone, consider injection subacromial/intraarticular if pain reoccurs - continue ROM RTC exercises that he learned in PT months ago

## 2018-10-09 NOTE — Patient Instructions (Signed)
Today, we reviewed your MRI results of your right shoulder. You have:  1. Complete tear of your supraspinatus rotator cuff muscle with retraction (it has been there for some time) 2. Complete tear of your biceps tendon  We are treating you with a 12 day course of oral prednisone (steroids). Side effects include but are not limited to: increased energy, frequent urination, water retention, weight gain, hyperglycemia. I will see you back in 3 weeks to see if you have significant pain relief.  Do not take ibuprofen or anti-inflammatories while on steroids. You may take acetaminophen 1000mg  three times per day as needed for pain.  Your right shoulder also has a rotator cuff tendinopathy with probable tear of your supraspinatus. We will see how much the steroids help with your improvement. Stop the nitroglycerin patch.

## 2018-10-09 NOTE — Progress Notes (Signed)
Kyle Greene. - 77 y.o. male MRN [id number]  Date of birth: 02/12/1941   Chief complaint: B/l shoulder pain, MRI results  SUBJECTIVE:    History of present illness: Kyle Greene is a 77 year old male who presents today with bilateral shoulder pain.  He has been having deep-seated right anterior lateral shoulder pain for over 5 months.  He denies any significant injury that he had however this is been a progressive injury.  He has received 2 separate subacromial injections one by his primary care physician in 1 here at this office with out significant relief of his shoulder pain.  Today his pain is rated 5 out of 10 and is significantly worse at night.  It does wake him up in the middle of the night due to aching.  He is unable to lay on his right shoulder secondary to pain.  In terms of his range of motion, he denies any significant loss of range of motion.  He states at times he feels a sudden sharp pain in his shoulder with motion however after getting through that pain, it returns to a dull ache.  He denies any significant loss of strength of that arm.  He still is working and works for 10-hour shifts with Fridays off.  Denies any numbness or tingling of the extremity.  Denies any cervical pain or elbow pain.  His left shoulder has been bothering him for approximately 2 months now.  He states he has been doing more activities with his left shoulder since his right one has been bothering him.  Denies any one injury that has occurred to that shoulder.  Denies any loss of grip strength.  Denies any muscle weakness.  He states overhead activities as well as scratching his back causing increasing pain.  Pain is rated 4 out of 10 in nature and is located primarily on the superior aspect of the left shoulder.  He has tried Advil which does help occasionally control his symptoms.  He has not yet had any injections of that shoulder.  Denies any numbness or tingling of the left upper extremity.   Review of  systems:  As stated above   Interval past medical history, surgical history, family history, and social history obtained and are unchanged.   Medications reviewed and unchanged. Allergies reviewed and unchanged.  OBJECTIVE:  Physical exam: Vital signs are reviewed. BP (!) 160/78   Ht 5\' 8"  (1.727 m)   Wt 170 lb (77.1 kg)   BMI 25.85 kg/m   Gen.: Alert, oriented, appears stated age, in no apparent distress Integumentary: No rashes or ecchymosis Neurologic:  Sensation is intact to light touch C5-T1 bilaterally, no focal neurologic deficits noted today Gait: normal without associated limp  Musculoskeletal: Inspection of the right upper extremity demonstrates no obvious swelling or deformity.  He has mild tenderness to palpation in the supraspinatus fossa.  He is able to flex his shoulder up to 180 degrees however he does activate his deltoid at approximately 20 to 30 degrees to accomplish this task.  He has external rotation capabilities to approximately C7.  He has internal rotation capabilities to L1 although mild discomfort with this.  He has a negative Neer's test.  Positive Hawkins test.  Discomfort elicited with empty can testing.  Positive O'Brien's test.  Negative speeds and Yergason's testing.  He is neurovascularly intact.  Inspection of the left upper extremity demonstrates no obvious swelling or deformity.  He has mild tenderness palpation in the bicipital groove.  He  is able to flex his shoulder up to 180 degrees however he does activate his deltoid at 30 degrees to accomplish this task.  He has external rotation to C7.  He is internal rotation to T12.  He has a negative Neer's test.  Positive Hawkins test.  Pain with empty can testing.  Negative O'Brien's test.  Positive speeds test.  Positive Yergason's test.  He is neurovascular intact.  Diagnostics: MRI RUE 10/02/18 IMPRESSION: 1. Large full-thickness retracted supraspinatus tendon tear. Some of the upper fibers of the  subscapularis tendon are also torn. 2. Torn and retracted long head biceps tendon. 3. No definite labral tears. 4. Moderate glenohumeral joint degenerative changes with small joint effusion.    ASSESSMENT & PLAN: Nontraumatic tear of right supraspinatus tendon - reviewed MRI results, complete supraspinatus tear with 2cm retraction and mild supraspinatus atrophy, complete long head biceps tear with retraction - Discussed patient would benefit from US guided glenohumeral injection, d/t not responding to the previous two subacromial injections, patient is hesitant to do this - With b/l sx, will place him on 12d Medrol dosepak. Counseled on common and severe side effects of medication, may take 1000mg  acetaminophen TID prn for breakthrough pain - f/u in 3 weeks, has to be on Friday as this is the patient's day off - He most likely will need routine injections q84mo if painful 1/2 subacromial, 1/2 US guided glenohumeral. - Failed PT, good ROM - reviewed MRI results with [facility], only beneficial surgery would be reverse total shoulder which is not recommended given his current ROM  Tendinopathy of left rotator cuff - [place] was performed last visit - exam today consistent with partial tearing vs complete tear of supraspinatus on LEFT - no acute injury - recommend similar nonoperative treatment measures for this, oral prednisone, consider injection subacromial/intraarticular if pain reoccurs - continue ROM RTC exercises that he learned in PT months ago  Nontraumatic rupture of long head of biceps tendon of right shoulder - MRI as above    Meds ordered this encounter  Medications  . predniSONE (DELTASONE) 10 MG tablet    Sig: Use as directed per doctors orders.    Dispense:  48 tablet    Refill:  0      Clydene Laming, DO Sports Medicine Fellow [facility]

## 2018-10-09 NOTE — Assessment & Plan Note (Signed)
-   MRI as above

## 2018-10-30 ENCOUNTER — Encounter: Payer: Self-pay | Admitting: Family Medicine

## 2018-10-30 ENCOUNTER — Ambulatory Visit (INDEPENDENT_AMBULATORY_CARE_PROVIDER_SITE_OTHER): Payer: PPO | Admitting: Family Medicine

## 2018-10-30 VITALS — BP 134/68 | Ht 68.0 in | Wt 170.0 lb

## 2018-10-30 DIAGNOSIS — M67912 Unspecified disorder of synovium and tendon, left shoulder: Secondary | ICD-10-CM | POA: Diagnosis not present

## 2018-10-30 DIAGNOSIS — M75101 Unspecified rotator cuff tear or rupture of right shoulder, not specified as traumatic: Secondary | ICD-10-CM

## 2018-10-30 NOTE — Assessment & Plan Note (Signed)
-  Greatly improved after the prednisone 12-day course -Now you may manage her symptoms with intermittent ibuprofen or acetaminophen -Your range of motion in your shoulder is quite good actually -If your symptoms recur, I am recommending follow-up appointment for consideration of ultrasound-guided glenohumeral joint and subacromial injection -Activity as tolerated being aware to keep any strenuous lifting within the strike zone

## 2018-10-30 NOTE — Assessment & Plan Note (Signed)
-  Improved significantly with prednisone -He does have slight referred pain to the lateral shoulder, however this is manageable.  It has not affected his range of motion nor his strength -Continue doing home exercise program for rotator cuff strengthening -Intermittent use of ibuprofen and Tylenol -If pain worsens or you start to get restricted range of motion, I am recommending follow-up visit for consideration of ultrasound-guided corticosteroid injection in the subacromial and glenohumeral joint space -I am very glad you are doing much better after the prednisone

## 2018-10-30 NOTE — Patient Instructions (Signed)
It was great to see you today.  You are doing so much better after taking the 12-day course of prednisone.  I am recommending continuing a home exercise program and taking ibuprofen as needed for pain and inflammation.  If your shoulder pain starts to worsen, I am recommending follow-up appointment and we will consider at that time an ultrasound-guided injection of your subacromial and glenohumeral joints.

## 2018-10-30 NOTE — Progress Notes (Signed)
Armando Bukhari. - 77 y.o. male MRN [id number]  Date of birth: 10/16/1941   Chief complaint: Bilateral shoulder pain follow-up left worse than right  SUBJECTIVE:    History of present illness: Kyle Greene is a 77 year old gentleman that presents today for follow-up of bilateral shoulder pain.  His last visit, he was diagnosed with a complete rupture of his supraspinatus tendon and biceps tendon on the right with retraction and atrophy.  He was also diagnosed with rotator cuff tendinopathy from ultrasound and clinical exam on the left.  He previously failed subacromial joint injections bilaterally several months ago.  He was placed on a 12-day course of oral prednisone and reports near resolution of his pain.  His right shoulder feels quite good.  He is no longer having nighttime symptoms of pain that wakes him up in the middle of the night.  He also has regained significant motion after taking the prednisone.  He is having lingering left-sided lateral shoulder pain.  This is rated 2 out of 10 in nature and is manageable.  He denies any numbness or tingling.  Pain is worse with overhead lifting.  No new injuries.  He states that his motion has improved significantly with his left shoulder as well.  He is right-handed.  Denies any neck pain, numbness or tingling.  No weakness.   Review of systems:  As stated above   Interval past medical history, surgical history, family history, and social history obtained and are unchanged.   Of note, he is a former smoker however quit in 1997.  He is retired.  Medications reviewed.  He has completed his course of prednisone and is no longer taking meloxicam. Allergies reviewed.  He has an intolerance to statin medications.  OBJECTIVE:  Physical exam: Vital signs are reviewed. BP 134/68   Ht 5\' 8"  (1.727 m)   Wt 170 lb (77.1 kg)   BMI 25.85 kg/m   Gen.: Alert, oriented, appears stated age, in no apparent distress Integumentary: No rashes or  ecchymoses Neurologic: Sensation is intact to light touch C5-T1 bilaterally, negative Spurling's test bilaterally Gait: normal without associated limp Musculoskeletal: Inspection of the left shoulder demonstrates no acute abnormalities.  He has no tenderness to palpation in the bicipital groove or supraspinatus fossa.  He has quite good range of motion with flexion to 180 degrees and abduction to 160 degrees and pain-free.  He is able to externally rotate to C7 and internally rotate to L2.  Strength testing is 4.5 out of 5 in shoulder flexion and 4.5 out of 5 in shoulder abduction.  Negative Hawkins test.  Mild discomfort with empty can testing.  Negative Neer's test.  Negative O'Brien's test.  Negative crossarm test.  Negative speeds and Yergason's testing.  He is neurovascular intact.   Inspection of the right shoulder demonstrates no acute abnormalities.  He has no tenderness to palpation.  His range of motion is 180 degrees in flexion and 160 degrees in abduction.  External rotation is to C7 and internal rotation is to L2.  Strength testing is 5 out of 5 in shoulder flexion and 5 out of 5 in shoulder abduction.  Negative Hawkins, speeds, Yergason's testing.  Positive empty can test.  Negative drop arm test.  Negative crossarm test.  He is neurovascular intact.    ASSESSMENT & PLAN: Nontraumatic tear of right supraspinatus tendon -Greatly improved after the prednisone 12-day course -Now you may manage her symptoms with intermittent ibuprofen or acetaminophen -Your range of motion in  your shoulder is quite good actually -If your symptoms recur, I am recommending follow-up appointment for consideration of ultrasound-guided glenohumeral joint and subacromial injection -Activity as tolerated being aware to keep any strenuous lifting within the strike zone  Tendinopathy of left rotator cuff -Improved significantly with prednisone -He does have slight referred pain to the lateral shoulder, however  this is manageable.  It has not affected his range of motion nor his strength -Continue doing home exercise program for rotator cuff strengthening -Intermittent use of ibuprofen and Tylenol -If pain worsens or you start to get restricted range of motion, I am recommending follow-up visit for consideration of ultrasound-guided corticosteroid injection in the subacromial and glenohumeral joint space -I am very glad you are doing much better after the prednisone     Clydene Laming, [place]

## 2018-10-30 NOTE — Progress Notes (Signed)
[facility]: Attending Note: I have reviewed the chart, discussed wit the Sports Medicine Fellow. I agree with assessment and treatment plan as detailed in the Fellow's note.  

## 2019-01-22 ENCOUNTER — Ambulatory Visit: Payer: PPO | Admitting: Family Medicine

## 2019-01-22 VITALS — BP 150/72 | Ht 68.0 in | Wt 170.0 lb

## 2019-01-22 DIAGNOSIS — M7582 Other shoulder lesions, left shoulder: Secondary | ICD-10-CM | POA: Diagnosis not present

## 2019-01-22 DIAGNOSIS — M7581 Other shoulder lesions, right shoulder: Secondary | ICD-10-CM | POA: Diagnosis not present

## 2019-01-22 MED ORDER — METHYLPREDNISOLONE ACETATE 40 MG/ML IJ SUSP
40.0000 mg | Freq: Once | INTRAMUSCULAR | Status: AC
  Administered 2019-01-22: 40 mg via INTRA_ARTICULAR

## 2019-01-22 NOTE — Progress Notes (Signed)
HPI  CC: Bilateral shoulder pain  Kyle Greene is a 78 year old male who presents for bilateral shoulder pain.  The pain is been present for now a year.  He was seen multiple times this clinic.  He was last injected in August 2019.  He states he got some relief from those injections.  He has a known full-thickness tear of the supraspinatus on his right shoulder.  This is not limited his ability to do overhead activities.  He states that he does have some pain with overactivity, but it is variable.  He also has nighttime pain when he lies over the right side.  He states he previously went to physical therapy, which did not help much with the pain.  He states he takes 1 ibuprofen maybe once every 1 to 2 weeks.  He denies any numbness and tingling down his arms.  Denies any weakness of his arms.  He states the pain feels like a " toothache" in his shoulder.  See HPI and/or previous note for associated ROS.  Objective: BP (!) 150/72   Ht 5\' 8"  (1.727 m)   Wt 170 lb (77.1 kg)   BMI 25.85 kg/m  Gen: Right-Hand Dominant. NAD, well groomed, a/o x3, normal affect.  CV: Well-perfused. Warm.  Resp: Non-labored.  Neuro: Sensation intact throughout. No gross coordination deficits.  Gait: Nonpathologic posture, unremarkable stride without signs of limp or balance issues.  Left shoulder exam: No erythema, warmth, swelling noted.  Tenderness palpation of the bicipital groove.  Tenderness palpation of the posterior shoulder.  Full range of motion forward flexion, abduction, internal and external rotation.  Strength 5 out of 5 throughout testing.  Negative speeds test, negative empty can test, negative belly press off test, negative crossover test, positive Hawkin's test.  Right shoulder exam: No erythema, warmth, swelling noted.  Tender palpation of the bicipital groove.  Tenderness palpation of the AC joint.  Full range of motion of forward flexion, abduction, internal and external rotation.  Strength 5 out of 5  throughout all testing.  Negative speeds test, negative Yergason's test, positive empty can test, negative belly press off test, negative crossover test, positive Hawkin's test  Assessment and plan: Bilateral shoulder pain, with a full-thickness tear and supraspinatus on the right side seen on MRI obtained last year.  INJECTION: Patient was given informed consent, signed copy in the chart. Appropriate time out was taken. Area prepped and draped in usual sterile fashion.  2 cc of methylprednisolone 40 mg/ml plus 4 cc of 1% lidocaine without epinephrine was injected into the right subacromial space using a(n) posterior lateral approach. The patient tolerated the procedure well. There were no complications. Post procedure instructions were given.  INJECTION: Patient was given informed consent, signed copy in the chart. Appropriate time out was taken. Area prepped and draped in usual sterile fashion.  2 cc of methylprednisolone 40 mg/ml plus 4 cc of 1% lidocaine without epinephrine was injected into the left subacromial space using a(n) posterior lateral approach. The patient tolerated the procedure well. There were no complications. Post procedure instructions were given.  We discussed treatment options at today's visit.  I gave him 2 subacromial injections as noted above.  He tolerated the procedures well.  We did discuss that if he does not get lasting improvement on these injections, we can consider doing formal physical therapy.  We also discussed that if he does not have any pain relief, we can consider referring to an orthopedic surgeon to evaluate for shoulder  scope.  This could be beneficial for pain relief.  We will see him for follow-up in 4 to 6 weeks.  Lewanda Rife, MD [facility] Sports Medicine Fellow 01/22/2019 1:15 PM

## 2019-01-22 NOTE — Progress Notes (Signed)
[facility]: Attending Note: I have reviewed the chart, discussed wit the Sports Medicine Fellow. I agree with assessment and treatment plan as detailed in the [place] note. He might benefit from orthopedic evaluation with known RC tear, significant OA.he would like to see how much injection helps him first so we will schedule follow up in 4-6 weeks.

## 2019-03-05 ENCOUNTER — Other Ambulatory Visit: Payer: Self-pay

## 2019-03-05 ENCOUNTER — Ambulatory Visit (INDEPENDENT_AMBULATORY_CARE_PROVIDER_SITE_OTHER): Payer: PPO | Admitting: Family Medicine

## 2019-03-05 DIAGNOSIS — M67912 Unspecified disorder of synovium and tendon, left shoulder: Secondary | ICD-10-CM

## 2019-03-05 MED ORDER — METHYLPREDNISOLONE ACETATE 40 MG/ML IJ SUSP
40.0000 mg | Freq: Once | INTRAMUSCULAR | Status: AC
  Administered 2019-03-05: 40 mg via INTRA_ARTICULAR

## 2019-03-05 NOTE — Progress Notes (Signed)
Subjective:    Patient ID: Kyle Greene., male    DOB: 04-Feb-1941, 78 y.o.   MRN: [id number]  HPI 78 year old male who presents for bilateral shoulder pain follow-up.  Patient with MRI right shoulder in 10/02/2018 showing full-thickness supraspinatus tendon tear with partial subscapularis tendon tear.  He was last seen on 01/22/2019 and underwent right and left shoulder subacromial bursa injection.  States that he got a lot of benefit from these injections.  States that his right shoulder is still doing very well with no pain or issues.  His left shoulder initially got a lot of benefit but has been gradually giving him more more pain.  States that it is a dull ache which is intermittently present.  He can occasionally feel more pain with work, he works as a Architectural technologist at [employer].  He has taken ibuprofen once every 3 to 4 days.  States that he has done physical therapy before but did not help very much.  He is interested in another steroid injection.   Review of Systems HPI    Objective:   Physical Exam Gen: Right-Hand Dominant. NAD, well groomed, a/o x3, normal affect.  Resp:  No accessory muscle use, no respiratory distress Neuro: Sensation intact throughout. No gross coordination deficits.  Gait: Nonpathologic posture, unremarkable stride without signs of limp or balance issues.  Right Shoulder Inspection: No swelling or erythema noted.  No gross shoulder deformity Palpation: No tenderness palpation over AC joint or bicipital groove. Range of motion: full range of motion to forward flexion, abduction, internal, external rotation Strength: 5 out of 5 strength to shoulder flexion, abduction, adduction, extension Stability: No evidence shoulder instability Neurovascular: No focal neurologic abnormality.  Less than 2-second cap refill Special test: Positive Hawkins test.  Negative empty can, negative liftoff negative Yergason  Left Shoulder Inspection: No swelling or  erythema noted.  No gross shoulder deformity Palpation: No tenderness palpation over AC joint or bicipital groove. Range of motion: full range of motion to forward flexion, abduction, internal, external rotation.  Pain elicited with range of motion up until 45 degrees with flexion and abduction. Strength: 5 out of 5 strength to shoulder flexion, abduction, adduction, extension Stability: No evidence shoulder instability Neurovascular: No focal neurologic abnormality.  Less than 2-second cap refill Special test: Positive Hawkins test.  Positive crossover, negative empty can, negative liftoff negative Yergason     Assessment & Plan:  78 year old male with known supraspinatus tendon tear and right shoulder likely rotator cuff tendinitis and left shoulder presents for follow-up.  Has been doing well with bilateral steroid injection. Unfortunately appears the left steroid action is wearing off and providing less benefit at this time.  Offer patient additional subacromial bursa steroid injection.  He accepted.  Injected in joint and showed shoulder physical therapy exercises.  Right shoulder - continue current care - showed shoulder rehab exercises - continue ibuprofen as needed - follow up as needed  Left shoulder - subacromial bursa cortisone injection as below - showed shoulder rehab exercises - continue ibuprofen as needed - follow up as needed  PROCEDURE: INJECTION: Patient was given informed consent, signed copy in the chart. Appropriate time out was taken. Area prepped and draped in usual sterile fashion. Ethyl chloride was  used for local anesthesia. A 21 gauge 1 1/2 inch needle was used.. 1 cc of methylprednisolone 40 mg/ml plus  3 cc of 1% lidocaine without epinephrine was injected into the left subacromial bursa of shoulder using  a(n) posterior lateral approach.   The patient tolerated the procedure well. There were no complications. Post procedure instructions were given.  Guadalupe Dawn MD PGY-2 Family Medicine Resident

## 2019-03-05 NOTE — Patient Instructions (Signed)
We gave you a second subacromial bursa injection in your left shoulder today. If you are still having problems in 2-4 weeks, please come back or give [place] a call. Otherwise you can see [place] as you need. Great to see you!

## 2019-03-05 NOTE — Progress Notes (Signed)
[place] Attending Note: I have seen and examined this patient. I have discussed this patient with the resident and reviewed the assessment and plan as documented above. I agree with the resident's findings and plan. 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  He had some improvement after injection on the left but is only about 30 to 50% there is still having night pain.  He wants to try another injection today which we did.  I will also add home physical therapy program with [facility].  He will follow-up PRN.  I did tell him we can inject him every month but would certainly be willing to do a second injection today.

## 2019-04-08 ENCOUNTER — Ambulatory Visit: Payer: PPO | Admitting: Sports Medicine

## 2019-04-08 ENCOUNTER — Encounter: Payer: Self-pay | Admitting: Sports Medicine

## 2019-04-08 ENCOUNTER — Other Ambulatory Visit: Payer: Self-pay

## 2019-04-08 VITALS — BP 147/82 | HR 75 | Temp 97.8°F | Ht 68.0 in | Wt 170.0 lb

## 2019-04-08 DIAGNOSIS — F39 Unspecified mood [affective] disorder: Secondary | ICD-10-CM | POA: Diagnosis not present

## 2019-04-08 DIAGNOSIS — M25511 Pain in right shoulder: Secondary | ICD-10-CM

## 2019-04-08 MED ORDER — METHYLPREDNISOLONE ACETATE 40 MG/ML IJ SUSP
40.0000 mg | Freq: Once | INTRAMUSCULAR | Status: AC
  Administered 2019-04-08: 16:00:00 40 mg

## 2019-04-08 NOTE — Progress Notes (Signed)
  Kyle Greene. - 78 y.o. male MRN [id number]  Date of birth: 04-Apr-1941    SUBJECTIVE:      Chief Complaint: Right shoulder pain  HPI:  78 year old male with 7/10 right shoulder pain for 1 week.  Patient states he was pulling the pull cord on a pressure washer and felt a pop and pain in his right shoulder.  He notes having pain with reaching behind his back or overhead movement since that time.  He also has pain with laying on his side.  This makes it difficult to sleep comfortably.  He is taking ibuprofen without significant benefit he denies any numbness or tingling distally.  He denies any swelling or erythema.   ROS:     See HPI. All other reviewed systems negative.  PERTINENT  PMH / PSH FH / / SH:  Past Medical, Surgical, Social, and Family History Reviewed & Updated in the EMR.  Pertinent findings include:  Patient has known supraspinatus and biceps tendon tear in the right shoulder from MRI in October 2019.  OBJECTIVE: BP (!) 147/82   Pulse 75   Temp 97.8 F (36.6 C) (Oral)   Ht 5\' 8"  (1.727 m)   Wt 170 lb (77.1 kg)   BMI 25.85 kg/m   Physical Exam:  Vital signs are reviewed.  GEN: Alert and oriented, NAD Pulm: Breathing unlabored PSY: normal mood, congruent affect  MSK: Right shoulder: No obvious deformity or asymmetry. No bruising. No swelling No focal TTP  He does have near full ROM in flexion, abduction, internal/external rotation.  Pain with abduction and internal rotation NV intact distally Special Tests:  - Impingement: Pain with Hawkins and Neers.  - Supraspinatus: Pain with empty can.  4+/5 strength - Infraspinatus/Teres: /5 strength with ER - Subscapularis: Positive belly press. 4/5 strength with IR . MSK [place]: Limited ultrasound of the right shoulder.  The subscapularis appears to have a full-thickness tear with approximately 0.7 cm of retraction   Left shoulder: No deformity Full range of motion with minimal pain.  Patient does have chronic left  shoulder pain as well. Normal strength with rotator cuff testing N/V intact distally  ASSESSMENT & PLAN:  1.  Right shoulder pain secondary to subscapularis tear.  Overall he has good range of motion despite pain -Subacromial injection performed today to provide relief -Continue ibuprofen as needed for pain - Basic rotator cuff strengthening exercises reviewed today -Patient will follow-up in 2 weeks.  If not improving consider referral to orthopedic surgery    Procedure performed: subacromial corticosteroid injection; palpation guided Consent obtained and verified. Time-out conducted. Noted no overlying erythema, induration, or other signs of local infection. The  right posterior subacromial space was palpated and marked. The overlying skin was prepped in a sterile fashion. Topical analgesic spray: Ethyl chloride. Joint: Right subacromial Needle: 25GA, 1.5" Completed without difficulty. Meds: Depo-Medrol 40 mg, lidocaine 4 cc  Patient seen and evaluated with the sports medicine fellow.  I agree with the above plan of care.  Right shoulder subacromial cortisone injection as above.  Follow-up in 2 weeks for reevaluation.  If still symptomatic, consider referral to orthopedics.

## 2019-04-15 IMAGING — DX DG SHOULDER 2+V*R*
3 series · 3 of 3 positions shown · non-contrast
Comparison: Chest radiographs 06/15/2014.

CLINICAL DATA: 77-year-old male with chronic right shoulder pain
and decreased range of motion.

EXAM:
RIGHT SHOULDER - 2+ VIEW

[dg shoulder right (1 of 3)]
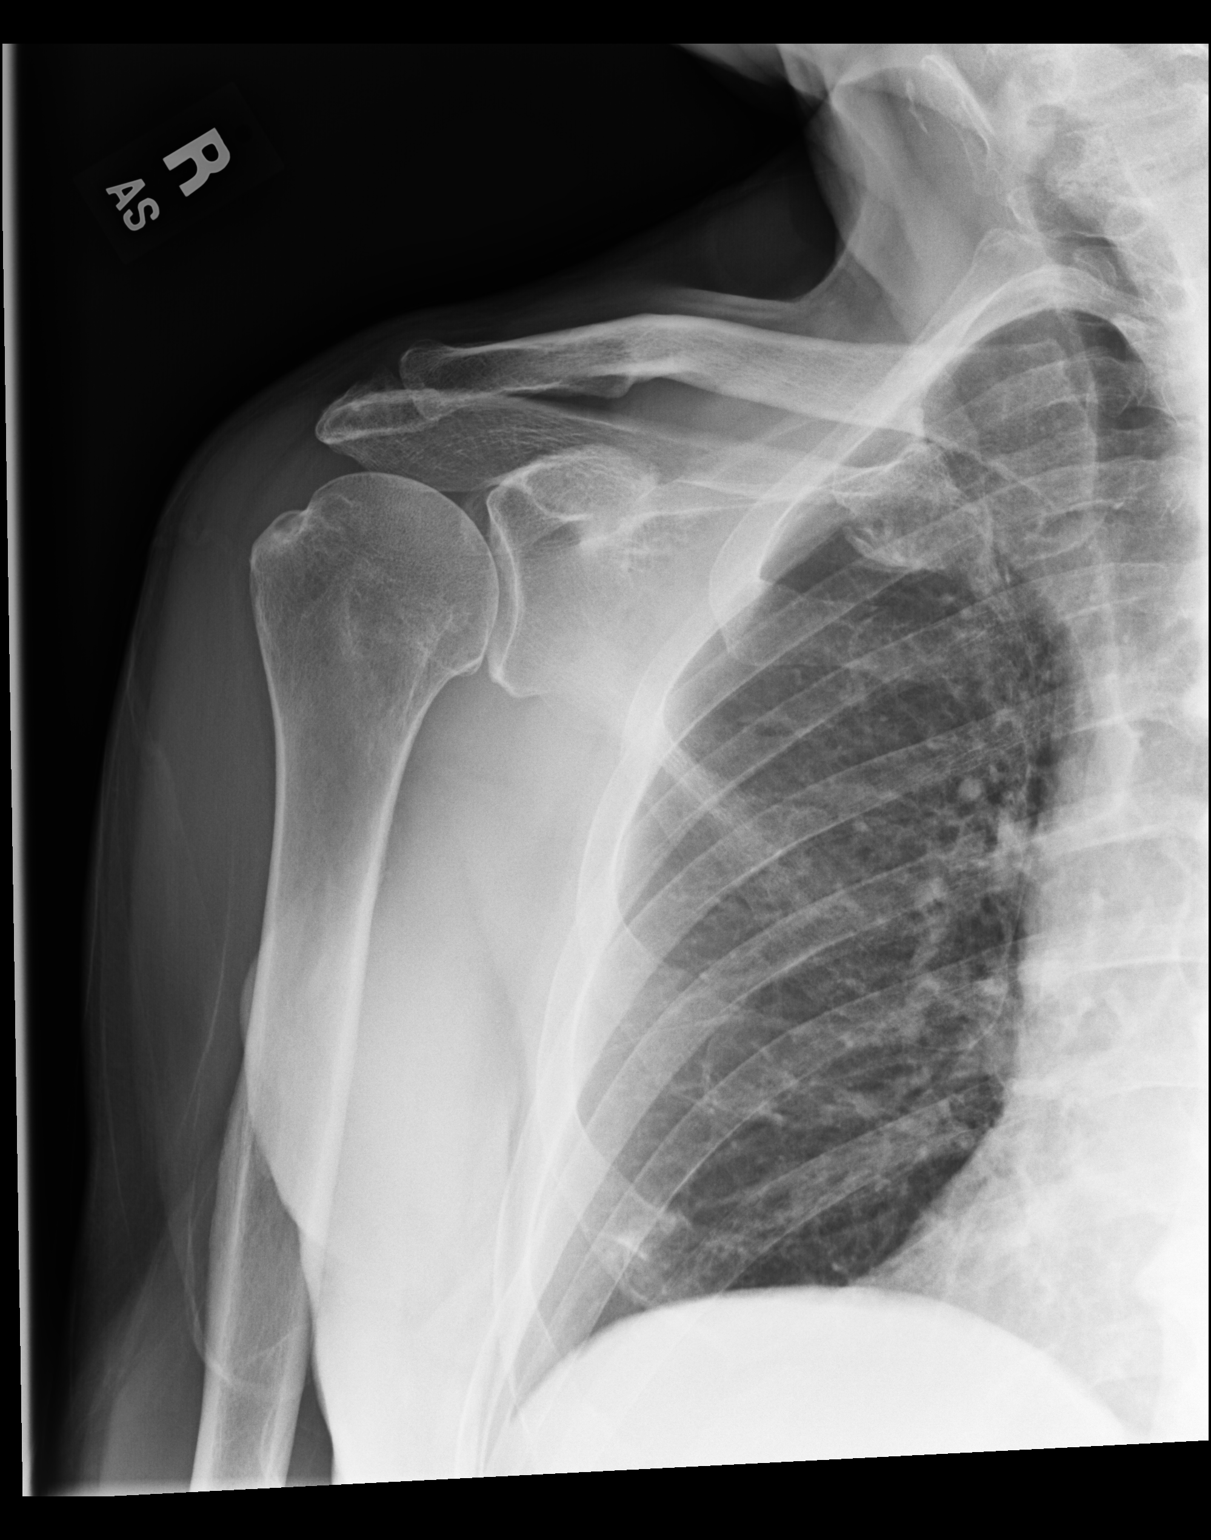

[dg shoulder right (2 of 3)]
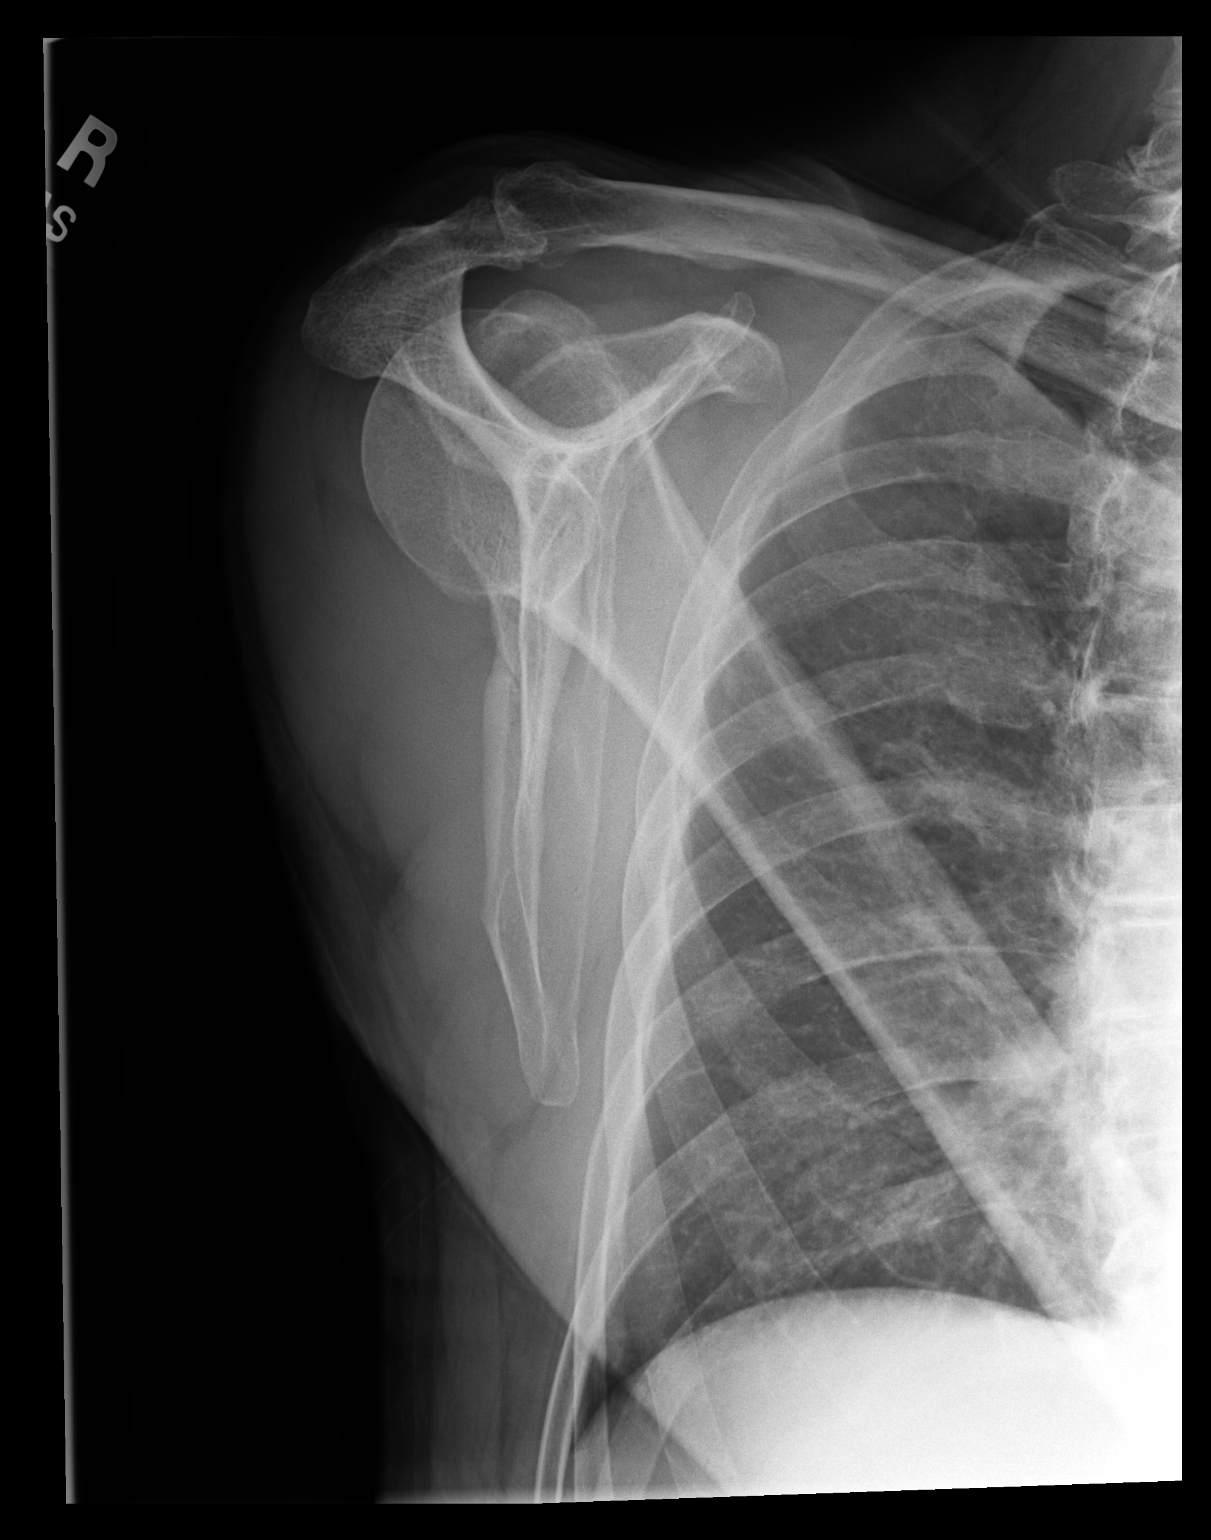

[dg shoulder right (3 of 3)]
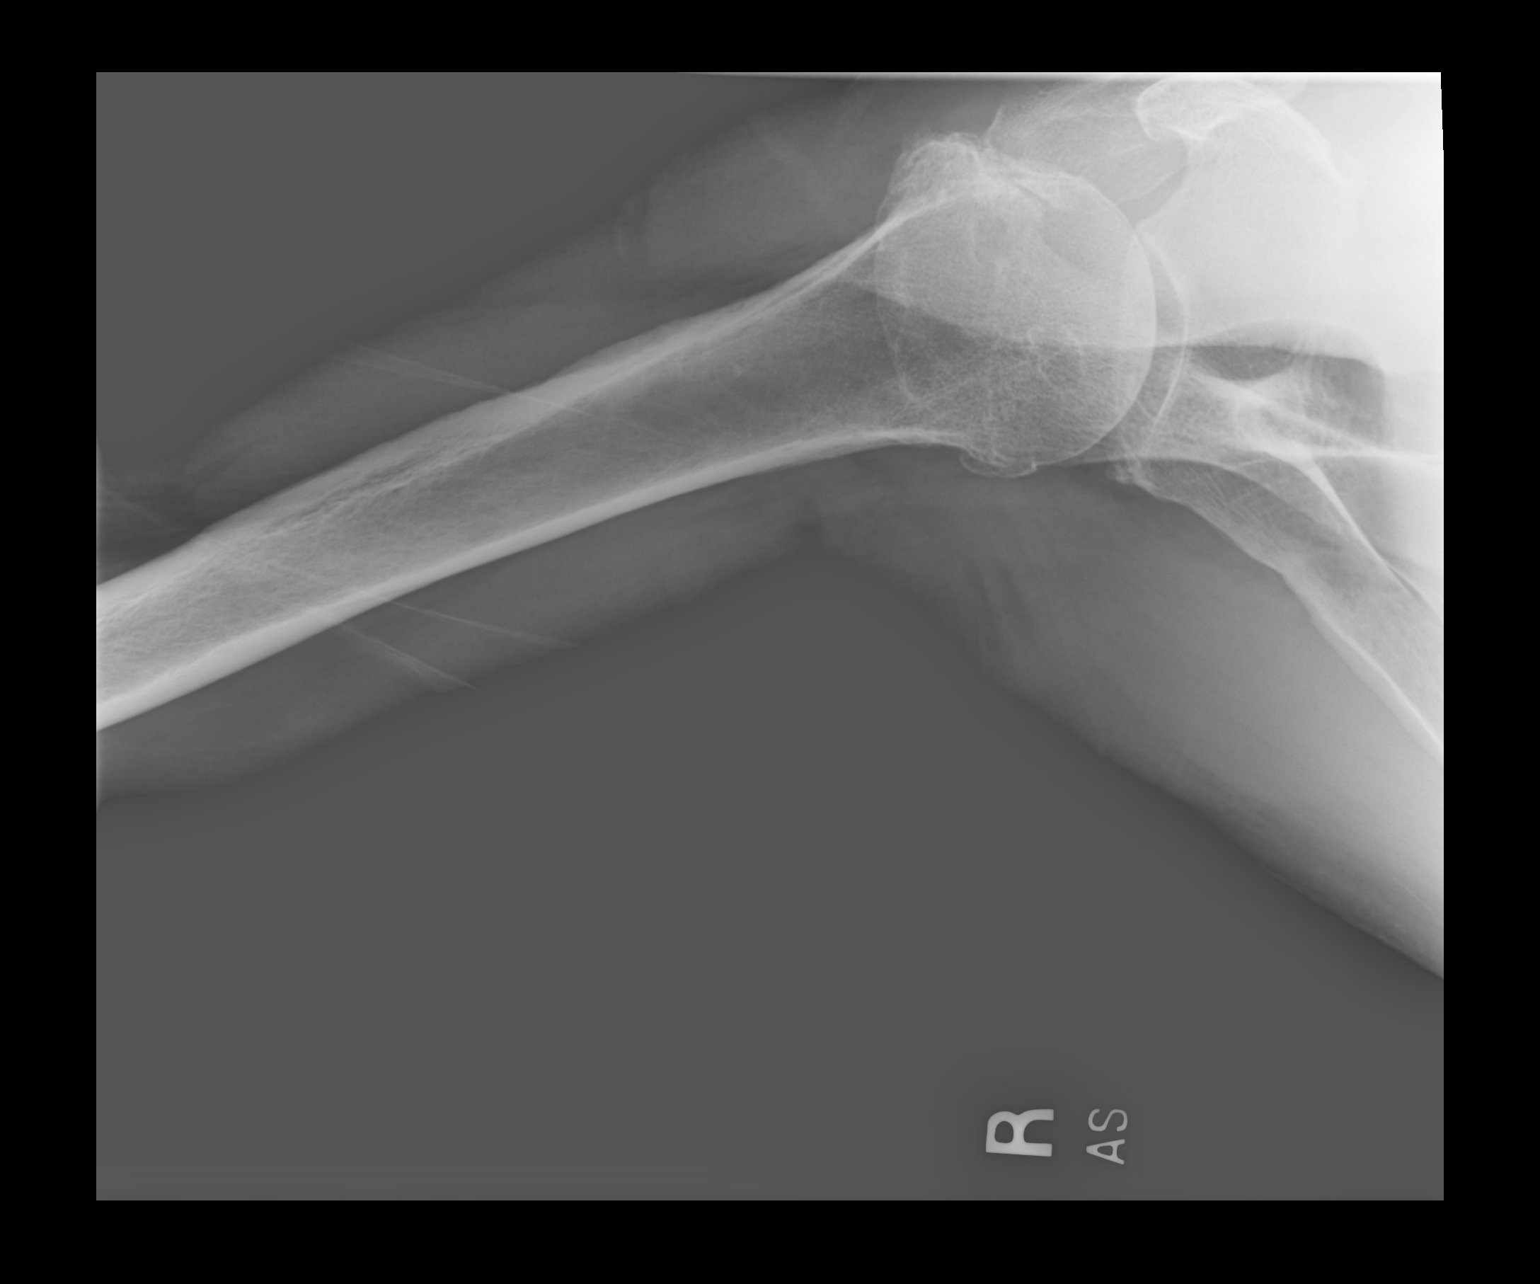

[3 of 3 positions shown; findings below may reference images not displayed]

FINDINGS: Bone mineralization is within normal limits for age. No glenohumeral
joint dislocation. There is glenohumeral joint space loss. Mild
degenerative spurring circumferentially at the humeral head. Intact
proximal right humerus. Right clavicle and scapula appear intact.
Negative visible right ribs and lung parenchyma.
IMPRESSION: Glenohumeral joint degeneration with no acute osseous abnormality
identified.

## 2019-04-29 ENCOUNTER — Ambulatory Visit (INDEPENDENT_AMBULATORY_CARE_PROVIDER_SITE_OTHER): Payer: PPO | Admitting: Sports Medicine

## 2019-04-29 ENCOUNTER — Encounter: Payer: Self-pay | Admitting: Sports Medicine

## 2019-04-29 ENCOUNTER — Other Ambulatory Visit: Payer: Self-pay

## 2019-04-29 VITALS — BP 134/78 | Ht 68.0 in | Wt 170.0 lb

## 2019-04-29 DIAGNOSIS — M75121 Complete rotator cuff tear or rupture of right shoulder, not specified as traumatic: Secondary | ICD-10-CM

## 2019-04-29 NOTE — Progress Notes (Signed)
   Subjective:    Patient ID: Kyle Misch., male    DOB: [birth year], 78 y.o.   MRN: [id number]  HPI  Kyle Greene comes in today for follow-up on right shoulder pain secondary to rotator cuff tear.  Unfortunately, subacromial cortisone injection did not provide him with any symptom relief.  His pain is interfering with his quality of life. Both ultrasound and MRI have shown evidence of chronic rotator cuff tearing.    Review of Systems As above    Objective:   Physical Exam  Well-developed, well-nourished.  No acute distress.  Right shoulder: Patient has fairly well-preserved range of motion although he does have pain with internal rotation.  4/5 strength with resisted supraspinatus and subscapularis.  Markedly positive empty can and Hawkins.  Neurovascular intact distally.      Assessment & Plan:   Persistent right shoulder pain secondary to chronic full-thickness rotator cuff tear  I would like for the patient to see Dr. Mardelle Matte in consultation.  He has a full-thickness retracted rotator cuff tear that is chronic with fatty atrophy.  I doubt he is a candidate for reconstruction but he may be a candidate for debridement for pain control.  I will defer further work-up and treatment to the discretion of Dr. Mardelle Matte and the patient will follow-up with me as needed.

## 2019-04-29 NOTE — Patient Instructions (Signed)
Dr Domingo Sep & [facility] [place] [address] [phone] Monday May 11th at 1145a

## 2019-05-03 DIAGNOSIS — S46011A Strain of muscle(s) and tendon(s) of the rotator cuff of right shoulder, initial encounter: Secondary | ICD-10-CM | POA: Diagnosis not present

## 2019-05-19 ENCOUNTER — Encounter (HOSPITAL_BASED_OUTPATIENT_CLINIC_OR_DEPARTMENT_OTHER): Payer: Self-pay | Admitting: *Deleted

## 2019-05-19 ENCOUNTER — Other Ambulatory Visit: Payer: Self-pay

## 2019-05-21 ENCOUNTER — Other Ambulatory Visit (HOSPITAL_COMMUNITY)
Admission: RE | Admit: 2019-05-21 | Discharge: 2019-05-21 | Disposition: A | Discharge status: Home / Self Care | Payer: PPO | Source: Ambulatory Visit | Attending: Orthopedic Surgery | Admitting: Orthopedic Surgery

## 2019-05-21 ENCOUNTER — Other Ambulatory Visit: Payer: Self-pay

## 2019-05-21 ENCOUNTER — Encounter (HOSPITAL_BASED_OUTPATIENT_CLINIC_OR_DEPARTMENT_OTHER)
Admission: RE | Admit: 2019-05-21 | Discharge: 2019-05-21 | Disposition: A | Discharge status: Home / Self Care | Payer: PPO | Source: Ambulatory Visit | Attending: Orthopedic Surgery | Admitting: Orthopedic Surgery

## 2019-05-21 DIAGNOSIS — X58XXXA Exposure to other specified factors, initial encounter: Secondary | ICD-10-CM | POA: Diagnosis not present

## 2019-05-21 DIAGNOSIS — M7541 Impingement syndrome of right shoulder: Secondary | ICD-10-CM | POA: Diagnosis not present

## 2019-05-21 DIAGNOSIS — S43491A Other sprain of right shoulder joint, initial encounter: Secondary | ICD-10-CM | POA: Diagnosis not present

## 2019-05-21 DIAGNOSIS — Z1159 Encounter for screening for other viral diseases: Secondary | ICD-10-CM | POA: Diagnosis not present

## 2019-05-21 DIAGNOSIS — I10 Essential (primary) hypertension: Secondary | ICD-10-CM | POA: Diagnosis not present

## 2019-05-21 DIAGNOSIS — K219 Gastro-esophageal reflux disease without esophagitis: Secondary | ICD-10-CM | POA: Diagnosis not present

## 2019-05-21 DIAGNOSIS — S46111A Strain of muscle, fascia and tendon of long head of biceps, right arm, initial encounter: Secondary | ICD-10-CM | POA: Diagnosis not present

## 2019-05-21 DIAGNOSIS — Z79899 Other long term (current) drug therapy: Secondary | ICD-10-CM | POA: Diagnosis not present

## 2019-05-21 DIAGNOSIS — M75121 Complete rotator cuff tear or rupture of right shoulder, not specified as traumatic: Secondary | ICD-10-CM | POA: Diagnosis not present

## 2019-05-21 DIAGNOSIS — Z87891 Personal history of nicotine dependence: Secondary | ICD-10-CM | POA: Diagnosis not present

## 2019-05-21 LAB — BASIC METABOLIC PANEL
Anion gap: 9 (ref 5–15)
BUN: 12 mg/dL (ref 8–23)
CO2: 25 mmol/L (ref 22–32)
Calcium: 9.2 mg/dL (ref 8.9–10.3)
Chloride: 105 mmol/L (ref 98–111)
Creatinine, Ser: 0.91 mg/dL (ref 0.61–1.24)
GFR calc Af Amer: >60 mL/min (ref >60)
GFR calc non Af Amer: >60 mL/min (ref >60)
Glucose, Bld: 87 mg/dL (ref 70–99)
Potassium: 3.8 mmol/L (ref 3.5–5.1)
Sodium: 139 mmol/L (ref 135–145)

## 2019-05-21 NOTE — Progress Notes (Signed)
Ensure pre surgery drink given with instructions to complete by 0800 dos, pt verbalized understanding.

## 2019-05-22 LAB — NOVEL CORONAVIRUS, NAA (HOSP ORDER, SEND-OUT TO REF LAB; TAT 18-24 HRS): SARS-CoV-2, NAA: NOT DETECTED

## 2019-05-25 ENCOUNTER — Ambulatory Visit (HOSPITAL_BASED_OUTPATIENT_CLINIC_OR_DEPARTMENT_OTHER): Payer: PPO | Admitting: Anesthesiology

## 2019-05-25 ENCOUNTER — Ambulatory Visit (HOSPITAL_BASED_OUTPATIENT_CLINIC_OR_DEPARTMENT_OTHER)
Admission: RE | Admit: 2019-05-25 | Discharge: 2019-05-25 | Disposition: A | Discharge status: Home / Self Care | Payer: PPO | Attending: Orthopedic Surgery | Admitting: Orthopedic Surgery

## 2019-05-25 ENCOUNTER — Encounter (HOSPITAL_BASED_OUTPATIENT_CLINIC_OR_DEPARTMENT_OTHER): Payer: Self-pay

## 2019-05-25 ENCOUNTER — Encounter (HOSPITAL_BASED_OUTPATIENT_CLINIC_OR_DEPARTMENT_OTHER)
Admission: RE | Disposition: A | Discharge status: Home / Self Care | Payer: Self-pay | Source: Home / Self Care | Attending: Orthopedic Surgery

## 2019-05-25 ENCOUNTER — Other Ambulatory Visit: Payer: Self-pay

## 2019-05-25 DIAGNOSIS — Z79899 Other long term (current) drug therapy: Secondary | ICD-10-CM | POA: Insufficient documentation

## 2019-05-25 DIAGNOSIS — I10 Essential (primary) hypertension: Secondary | ICD-10-CM | POA: Diagnosis not present

## 2019-05-25 DIAGNOSIS — M7541 Impingement syndrome of right shoulder: Secondary | ICD-10-CM | POA: Diagnosis not present

## 2019-05-25 DIAGNOSIS — K219 Gastro-esophageal reflux disease without esophagitis: Secondary | ICD-10-CM | POA: Insufficient documentation

## 2019-05-25 DIAGNOSIS — M24111 Other articular cartilage disorders, right shoulder: Secondary | ICD-10-CM | POA: Diagnosis not present

## 2019-05-25 DIAGNOSIS — Z87891 Personal history of nicotine dependence: Secondary | ICD-10-CM | POA: Insufficient documentation

## 2019-05-25 DIAGNOSIS — S46111A Strain of muscle, fascia and tendon of long head of biceps, right arm, initial encounter: Secondary | ICD-10-CM | POA: Insufficient documentation

## 2019-05-25 DIAGNOSIS — S46811A Strain of other muscles, fascia and tendons at shoulder and upper arm level, right arm, initial encounter: Secondary | ICD-10-CM | POA: Diagnosis not present

## 2019-05-25 DIAGNOSIS — M949 Disorder of cartilage, unspecified: Secondary | ICD-10-CM | POA: Diagnosis not present

## 2019-05-25 DIAGNOSIS — M75101 Unspecified rotator cuff tear or rupture of right shoulder, not specified as traumatic: Secondary | ICD-10-CM | POA: Diagnosis present

## 2019-05-25 DIAGNOSIS — M75121 Complete rotator cuff tear or rupture of right shoulder, not specified as traumatic: Secondary | ICD-10-CM | POA: Insufficient documentation

## 2019-05-25 DIAGNOSIS — S43491A Other sprain of right shoulder joint, initial encounter: Secondary | ICD-10-CM | POA: Insufficient documentation

## 2019-05-25 DIAGNOSIS — X58XXXA Exposure to other specified factors, initial encounter: Secondary | ICD-10-CM | POA: Insufficient documentation

## 2019-05-25 DIAGNOSIS — G8918 Other acute postprocedural pain: Secondary | ICD-10-CM | POA: Diagnosis not present

## 2019-05-25 DIAGNOSIS — S46011A Strain of muscle(s) and tendon(s) of the rotator cuff of right shoulder, initial encounter: Secondary | ICD-10-CM | POA: Diagnosis not present

## 2019-05-25 HISTORY — PX: SHOULDER ARTHROSCOPY WITH ROTATOR CUFF REPAIR AND SUBACROMIAL DECOMPRESSION: SHX5686

## 2019-05-25 HISTORY — DX: Unspecified rotator cuff tear or rupture of right shoulder, not specified as traumatic: M75.101

## 2019-05-25 HISTORY — DX: Gastro-esophageal reflux disease without esophagitis: K21.9

## 2019-05-25 SURGERY — SHOULDER ARTHROSCOPY WITH ROTATOR CUFF REPAIR AND SUBACROMIAL DECOMPRESSION
Anesthesia: General | Site: Shoulder | Laterality: Right

## 2019-05-25 MED ORDER — EPHEDRINE 5 MG/ML INJ
INTRAVENOUS | Status: AC
  Filled 2019-05-25: qty 10

## 2019-05-25 MED ORDER — CHLORHEXIDINE GLUCONATE 4 % EX LIQD: 60.0000 mL | Freq: Once | CUTANEOUS | Status: DC

## 2019-05-25 MED ORDER — BUPIVACAINE HCL (PF) 0.5 % IJ SOLN
INTRAMUSCULAR | Status: DC | PRN
  Administered 2019-05-25: 15 mL via PERINEURAL

## 2019-05-25 MED ORDER — DEXAMETHASONE SODIUM PHOSPHATE 10 MG/ML IJ SOLN
INTRAMUSCULAR | Status: AC
  Filled 2019-05-25: qty 1

## 2019-05-25 MED ORDER — BUPIVACAINE HCL (PF) 0.5 % IJ SOLN
INTRAMUSCULAR | Status: AC
  Filled 2019-05-25: qty 30

## 2019-05-25 MED ORDER — SCOPOLAMINE 1 MG/3DAYS TD PT72: 1.0000 | MEDICATED_PATCH | Freq: Once | TRANSDERMAL | Status: DC | PRN

## 2019-05-25 MED ORDER — FENTANYL CITRATE (PF) 100 MCG/2ML IJ SOLN
INTRAMUSCULAR | Status: AC
  Filled 2019-05-25: qty 2

## 2019-05-25 MED ORDER — ROCURONIUM BROMIDE 10 MG/ML (PF) SYRINGE
PREFILLED_SYRINGE | INTRAVENOUS | Status: AC
  Filled 2019-05-25: qty 10

## 2019-05-25 MED ORDER — PROPOFOL 10 MG/ML IV BOLUS
INTRAVENOUS | Status: DC | PRN
  Administered 2019-05-25: 150 mg via INTRAVENOUS

## 2019-05-25 MED ORDER — ONDANSETRON HCL 4 MG PO TABS: 4.0000 mg | ORAL_TABLET | Freq: Three times a day (TID) | ORAL | 0 refills | Status: AC | PRN

## 2019-05-25 MED ORDER — MIDAZOLAM HCL 2 MG/2ML IJ SOLN
INTRAMUSCULAR | Status: AC
  Filled 2019-05-25: qty 2

## 2019-05-25 MED ORDER — FENTANYL CITRATE (PF) 100 MCG/2ML IJ SOLN
50.0000 ug | INTRAMUSCULAR | Status: DC | PRN
  Administered 2019-05-25: 100 ug via INTRAVENOUS

## 2019-05-25 MED ORDER — ACETAMINOPHEN 500 MG PO TABS
1000.0000 mg | ORAL_TABLET | Freq: Once | ORAL | Status: AC
  Administered 2019-05-25: 1000 mg via ORAL

## 2019-05-25 MED ORDER — SENNA-DOCUSATE SODIUM 8.6-50 MG PO TABS: 2.0000 | ORAL_TABLET | Freq: Every day | ORAL | 1 refills | Status: AC

## 2019-05-25 MED ORDER — CEFAZOLIN SODIUM-DEXTROSE 2-4 GM/100ML-% IV SOLN
INTRAVENOUS | Status: AC
  Filled 2019-05-25: qty 100

## 2019-05-25 MED ORDER — ACETAMINOPHEN 500 MG PO TABS
ORAL_TABLET | ORAL | Status: AC
  Filled 2019-05-25: qty 2

## 2019-05-25 MED ORDER — SUCCINYLCHOLINE CHLORIDE 200 MG/10ML IV SOSY
PREFILLED_SYRINGE | INTRAVENOUS | Status: AC
  Filled 2019-05-25: qty 10

## 2019-05-25 MED ORDER — ONDANSETRON HCL 4 MG/2ML IJ SOLN
INTRAMUSCULAR | Status: AC
  Filled 2019-05-25: qty 2

## 2019-05-25 MED ORDER — BUPIVACAINE HCL (PF) 0.25 % IJ SOLN
INTRAMUSCULAR | Status: AC
  Filled 2019-05-25: qty 30

## 2019-05-25 MED ORDER — GLYCOPYRROLATE 0.2 MG/ML IJ SOLN
INTRAMUSCULAR | Status: DC | PRN
  Administered 2019-05-25: 0.1 mg via INTRAVENOUS

## 2019-05-25 MED ORDER — DEXAMETHASONE SODIUM PHOSPHATE 4 MG/ML IJ SOLN
INTRAMUSCULAR | Status: DC | PRN
  Administered 2019-05-25: 5 mg via INTRAVENOUS

## 2019-05-25 MED ORDER — LIDOCAINE 2% (20 MG/ML) 5 ML SYRINGE
INTRAMUSCULAR | Status: AC
  Filled 2019-05-25: qty 5

## 2019-05-25 MED ORDER — LIDOCAINE HCL (CARDIAC) PF 100 MG/5ML IV SOSY
PREFILLED_SYRINGE | INTRAVENOUS | Status: DC | PRN
  Administered 2019-05-25: 50 mg via INTRAVENOUS

## 2019-05-25 MED ORDER — BACLOFEN 10 MG PO TABS: 10.0000 mg | ORAL_TABLET | Freq: Three times a day (TID) | ORAL | 0 refills | Status: AC

## 2019-05-25 MED ORDER — SODIUM CHLORIDE 0.9 % IR SOLN
Status: DC | PRN
  Administered 2019-05-25: 12000 mL

## 2019-05-25 MED ORDER — SUGAMMADEX SODIUM 200 MG/2ML IV SOLN
INTRAVENOUS | Status: DC | PRN
  Administered 2019-05-25: 150 mg via INTRAVENOUS

## 2019-05-25 MED ORDER — CEFAZOLIN SODIUM-DEXTROSE 2-4 GM/100ML-% IV SOLN
2.0000 g | INTRAVENOUS | Status: AC
  Administered 2019-05-25: 13:00:00 2 g via INTRAVENOUS

## 2019-05-25 MED ORDER — BUPIVACAINE LIPOSOME 1.3 % IJ SUSP
INTRAMUSCULAR | Status: DC | PRN
  Administered 2019-05-25: 10 mL via PERINEURAL

## 2019-05-25 MED ORDER — SODIUM CHLORIDE 0.9 % IV SOLN
INTRAVENOUS | Status: DC | PRN
  Administered 2019-05-25: 13:00:00 20 ug/min via INTRAVENOUS

## 2019-05-25 MED ORDER — BUPIVACAINE-EPINEPHRINE (PF) 0.5% -1:200000 IJ SOLN
INTRAMUSCULAR | Status: AC
  Filled 2019-05-25: qty 30

## 2019-05-25 MED ORDER — HYDROCODONE-ACETAMINOPHEN 10-325 MG PO TABS: 1.0000 | ORAL_TABLET | Freq: Four times a day (QID) | ORAL | 0 refills | Status: AC | PRN

## 2019-05-25 MED ORDER — EPHEDRINE SULFATE 50 MG/ML IJ SOLN
INTRAMUSCULAR | Status: DC | PRN
  Administered 2019-05-25: 10 mg via INTRAVENOUS
  Administered 2019-05-25: 15 mg via INTRAVENOUS

## 2019-05-25 MED ORDER — PHENYLEPHRINE 40 MCG/ML (10ML) SYRINGE FOR IV PUSH (FOR BLOOD PRESSURE SUPPORT)
PREFILLED_SYRINGE | INTRAVENOUS | Status: AC
  Filled 2019-05-25: qty 10

## 2019-05-25 MED ORDER — LACTATED RINGERS IV SOLN
INTRAVENOUS | Status: DC
  Administered 2019-05-25 (×2): via INTRAVENOUS

## 2019-05-25 MED ORDER — ROCURONIUM BROMIDE 100 MG/10ML IV SOLN
INTRAVENOUS | Status: DC | PRN
  Administered 2019-05-25: 50 mg via INTRAVENOUS

## 2019-05-25 MED ORDER — MIDAZOLAM HCL 2 MG/2ML IJ SOLN: 1.0000 mg | INTRAMUSCULAR | Status: DC | PRN

## 2019-05-25 MED ORDER — ONDANSETRON HCL 4 MG/2ML IJ SOLN
INTRAMUSCULAR | Status: DC | PRN
  Administered 2019-05-25: 4 mg via INTRAVENOUS

## 2019-05-25 SURGICAL SUPPLY — 72 items
ANCH SUT SWLK 19.1X4.75 (Anchor) ×10 IMPLANT
ANCHOR SUT BIO SW 4.75X19.1 (Anchor) ×15 IMPLANT
BLADE EXCALIBUR 4.0MM X 13CM (MISCELLANEOUS)
BLADE EXCALIBUR 4.0X13 (MISCELLANEOUS) IMPLANT
BLADE SURG 15 STRL LF DISP TIS (BLADE) IMPLANT
BLADE SURG 15 STRL SS (BLADE)
BURR OVAL 8 FLU 5.0MM X 13CM (MISCELLANEOUS) ×1
BURR OVAL 8 FLU 5.0X13 (MISCELLANEOUS) ×2 IMPLANT
CANNULA 5.75X71 LONG (CANNULA) ×4 IMPLANT
CANNULA TWIST IN 8.25X7CM (CANNULA) ×6 IMPLANT
CLOSURE STERI-STRIP 1/2X4 (GAUZE/BANDAGES/DRESSINGS) ×1
CLSR STERI-STRIP ANTIMIC 1/2X4 (GAUZE/BANDAGES/DRESSINGS) ×3 IMPLANT
COVER WAND RF STERILE (DRAPES) IMPLANT
CUFF TOURN SGL QUICK 18X4 (TOURNIQUET CUFF) IMPLANT
CUFF TOURN SGL QUICK 34 (TOURNIQUET CUFF)
CUFF TRNQT CYL 34X4.125X (TOURNIQUET CUFF) IMPLANT
DECANTER SPIKE VIAL GLASS SM (MISCELLANEOUS) IMPLANT
DISSECTOR  3.8MM X 13CM (MISCELLANEOUS) ×2
DISSECTOR 3.8MM X 13CM (MISCELLANEOUS) ×2 IMPLANT
DRAPE IMP U-DRAPE 54X76 (DRAPES) ×4 IMPLANT
DRAPE INCISE IOBAN 66X45 STRL (DRAPES) ×4 IMPLANT
DRAPE SHOULDER BEACH CHAIR (DRAPES) ×4 IMPLANT
DRAPE U-SHAPE 47X51 STRL (DRAPES) ×4 IMPLANT
DRSG PAD ABDOMINAL 8X10 ST (GAUZE/BANDAGES/DRESSINGS) ×4 IMPLANT
DURAPREP 26ML APPLICATOR (WOUND CARE) ×4 IMPLANT
ELECT REM PT RETURN 9FT ADLT (ELECTROSURGICAL)
ELECTRODE REM PT RTRN 9FT ADLT (ELECTROSURGICAL) IMPLANT
FIBERSTICK 2 (SUTURE) IMPLANT
GAUZE SPONGE 4X4 12PLY STRL (GAUZE/BANDAGES/DRESSINGS) ×4 IMPLANT
GLOVE BIO SURGEON STRL SZ 6.5 (GLOVE) ×2 IMPLANT
GLOVE BIO SURGEON STRL SZ8 (GLOVE) ×4 IMPLANT
GLOVE BIO SURGEONS STRL SZ 6.5 (GLOVE) ×1
GLOVE BIOGEL PI IND STRL 7.0 (GLOVE) ×1 IMPLANT
GLOVE BIOGEL PI IND STRL 8 (GLOVE) ×5 IMPLANT
GLOVE BIOGEL PI INDICATOR 7.0 (GLOVE) ×2
GLOVE BIOGEL PI INDICATOR 8 (GLOVE) ×6
GLOVE ORTHO TXT STRL SZ7.5 (GLOVE) ×7 IMPLANT
GOWN STRL REUS W/ TWL LRG LVL3 (GOWN DISPOSABLE) ×2 IMPLANT
GOWN STRL REUS W/ TWL XL LVL3 (GOWN DISPOSABLE) ×3 IMPLANT
GOWN STRL REUS W/TWL LRG LVL3 (GOWN DISPOSABLE) ×4
GOWN STRL REUS W/TWL XL LVL3 (GOWN DISPOSABLE) ×4
IMMOBILIZER SHOULDER FOAM XLGE (SOFTGOODS) IMPLANT
LASSO 90 CVE QUICKPAS (DISPOSABLE) ×3 IMPLANT
MANIFOLD NEPTUNE II (INSTRUMENTS) ×4 IMPLANT
NDL SCORPION MULTI FIRE (NEEDLE) IMPLANT
NEEDLE SCORPION MULTI FIRE (NEEDLE) ×4 IMPLANT
PACK ARTHROSCOPY DSU (CUSTOM PROCEDURE TRAY) ×4 IMPLANT
PACK BASIN DAY SURGERY FS (CUSTOM PROCEDURE TRAY) ×4 IMPLANT
PORT APPOLLO RF 90DEGREE MULTI (SURGICAL WAND) ×4 IMPLANT
SHEET MEDIUM DRAPE 40X70 STRL (DRAPES) ×4 IMPLANT
SLEEVE SCD COMPRESS KNEE MED (MISCELLANEOUS) ×4 IMPLANT
SLING ARM FOAM STRAP LRG (SOFTGOODS) IMPLANT
SLING ARM IMMOBILIZER LRG (SOFTGOODS) ×3 IMPLANT
SLING ARM IMMOBILIZER MED (SOFTGOODS) IMPLANT
SLING ARM MED ADULT FOAM STRAP (SOFTGOODS) IMPLANT
SLING ARM XL FOAM STRAP (SOFTGOODS) IMPLANT
SUPPORT WRAP ARM LG (MISCELLANEOUS) ×4 IMPLANT
SUT FIBERWIRE #2 38 T-5 BLUE (SUTURE)
SUT MNCRL AB 4-0 PS2 18 (SUTURE) ×4 IMPLANT
SUT PDS AB 1 CT  36 (SUTURE)
SUT PDS AB 1 CT 36 (SUTURE) IMPLANT
SUT TIGER TAPE 7 IN WHITE (SUTURE) ×3 IMPLANT
SUT VIC AB 3-0 SH 27 (SUTURE)
SUT VIC AB 3-0 SH 27X BRD (SUTURE) IMPLANT
SUT VICRYL 3-0 CR8 SH (SUTURE) ×1 IMPLANT
SUTURE FIBERWR #2 38 T-5 BLUE (SUTURE) IMPLANT
TAPE FIBER 2MM 7IN #2 BLUE (SUTURE) ×6 IMPLANT
TOWEL GREEN STERILE FF (TOWEL DISPOSABLE) ×4 IMPLANT
TUBE CONNECTING 20'X1/4 (TUBING) ×2
TUBE CONNECTING 20X1/4 (TUBING) ×4 IMPLANT
TUBING ARTHROSCOPY IRRIG 16FT (MISCELLANEOUS) ×4 IMPLANT
WATER STERILE IRR 1000ML POUR (IV SOLUTION) ×4 IMPLANT

## 2019-05-25 NOTE — Anesthesia Preprocedure Evaluation (Signed)
Anesthesia Evaluation  Patient identified by MRN, date of birth, ID band Patient awake    Reviewed: Allergy & Precautions, NPO status , Patient's Chart, lab work & pertinent test results  Airway Mallampati: II  TM Distance: >3 FB Neck ROM: Full    Dental no notable dental hx. (+) Upper Dentures, Partial Lower   Pulmonary neg pulmonary ROS, former smoker,    Pulmonary exam normal breath sounds clear to auscultation       Cardiovascular hypertension, Pt. on medications Normal cardiovascular exam Rhythm:Regular Rate:Normal     Neuro/Psych negative neurological ROS  negative psych ROS   GI/Hepatic Neg liver ROS, GERD  Medicated and Controlled,  Endo/Other  negative endocrine ROS  Renal/GU negative Renal ROS  negative genitourinary   Musculoskeletal negative musculoskeletal ROS (+)   Abdominal   Peds negative pediatric ROS (+)  Hematology negative hematology ROS (+)   Anesthesia Other Findings HOH  Reproductive/Obstetrics negative OB ROS                             Anesthesia Physical Anesthesia Plan  ASA: II  Anesthesia Plan: General   Post-op Pain Management:  Regional for Post-op pain   Induction: Intravenous  PONV Risk Score and Plan: 2 and Ondansetron and Treatment may vary due to age or medical condition  Airway Management Planned: Oral ETT  Additional Equipment:   Intra-op Plan:   Post-operative Plan: Extubation in OR  Informed Consent: I have reviewed the patients History and Physical, chart, labs and discussed the procedure including the risks, benefits and alternatives for the proposed anesthesia with the patient or authorized representative who has indicated his/her understanding and acceptance.     Dental advisory given  Plan Discussed with: CRNA  Anesthesia Plan Comments:         Anesthesia Quick Evaluation

## 2019-05-25 NOTE — Anesthesia Procedure Notes (Signed)
Anesthesia Regional Block: Supraclavicular block   Pre-Anesthetic Checklist: ,, timeout performed, Correct Patient, Correct Site, Correct Laterality, Correct Procedure, Correct Position, site marked, Risks and benefits discussed,  Surgical consent,  Pre-op evaluation,  At surgeon's request and post-op pain management  Laterality: Right and Upper  Prep: Maximum Sterile Barrier Precautions used, chloraprep       Needles:  Injection technique: Single-shot  Needle Type: Echogenic Stimulator Needle     Needle Length: 10cm      Additional Needles:   Procedures:,,,, ultrasound used (permanent image in chart),,,,  Narrative:  Start time: 05/25/2019 11:21 AM End time: 05/25/2019 11:31 AM Injection made incrementally with aspirations every 5 mL.  Performed by: Personally  Anesthesiologist: Montez Hageman, MD  Additional Notes: Risks, benefits and alternative to block explained extensively.  Patient tolerated procedure well, without complications.

## 2019-05-25 NOTE — Progress Notes (Signed)
Assisted Dr. Marcell Barlow with right, ultrasound guided, supraclavicular block. Side rails up, monitors on throughout procedure. See vital signs in flow sheet. Tolerated Procedure well.

## 2019-05-25 NOTE — Anesthesia Procedure Notes (Signed)
Procedure Name: Intubation Date/Time: 05/25/2019 12:38 PM Performed by: Willa Frater, CRNA Pre-anesthesia Checklist: Patient identified, Emergency Drugs available, Suction available and Patient being monitored Patient Re-evaluated:Patient Re-evaluated prior to induction Oxygen Delivery Method: Circle system utilized Preoxygenation: Pre-oxygenation with 100% oxygen Induction Type: IV induction Ventilation: Mask ventilation without difficulty Laryngoscope Size: Mac and 3 Grade View: Grade I Tube type: Oral Number of attempts: 1 Airway Equipment and Method: Stylet and Oral airway Placement Confirmation: ETT inserted through vocal cords under direct vision,  positive ETCO2 and breath sounds checked- equal and bilateral Secured at: 23 cm Tube secured with: Tape Dental Injury: Teeth and Oropharynx as per pre-operative assessment

## 2019-05-25 NOTE — Op Note (Signed)
05/25/2019  3:04 PM  PATIENT:  Kyle Greene.    PRE-OPERATIVE DIAGNOSIS:   1.  Right shoulder supraspinatus tear and infraspinatus tear 2.  Right shoulder subscapularis tear 3.  Right shoulder impingement syndrome   POST-OPERATIVE DIAGNOSIS:    1.  Right shoulder supraspinatus tear and infraspinatus tear 2.  Right shoulder subscapularis tear 3.  Right shoulder impingement syndrome 4.  Right shoulder superior labral tearing with biceps tendon rupture  PROCEDURE:   1.  Right shoulder arthroscopy with extensive debridement, supraspinatus, biceps tendon stump, subscapularis, subdeltoid bursa 2.  Right shoulder arthroscopy with acromioplasty 3.  Right shoulder arthroscopy with repair of supraspinatus rotator cuff tendon 4.  Right shoulder arthroscopy with repair of infraspinatus rotator cuff tendon 5.  Right shoulder arthroscopy with repair of subscapularis rotator cuff tendon  SURGEON:  Johnny Bridge, MD  PHYSICIAN ASSISTANT: Joya Gaskins, OPA-C, present and scrubbed throughout the case, critical for completion in a timely fashion, and for retraction, instrumentation, and closure.  ANESTHESIA:   General with regional block using Exparel  PREOPERATIVE INDICATIONS:  Kyle Ybarbo. is a  78 y.o. male with significant shoulder pain who failed conservative measures and elected for surgical management.    The risks benefits and alternatives were discussed with the patient preoperatively including but not limited to the risks of infection, bleeding, nerve injury, cardiopulmonary complications, the need for revision surgery, among others, and the patient was willing to proceed.  ESTIMATED BLOOD LOSS: 50 mL  OPERATIVE IMPLANTS: Arthrex Biocomposite SwiveLock 4.75 x 2 for the medial row preloaded with fibertape, and 2 lateral Biocomposite SwiveLock 4.75 mm anchors for the lateral row in a SpeedBridge configuration.  I used a 4.75 mm swivel lock with a fiber tape in a horizontal  mattress configuration for the subscapularis as well.  I tied down the safety sutures in the medial row to augment medial fixation.  OPERATIVE FINDINGS: The shoulder had nearly full motion although did have a slightly gummy end feel with examination under anesthesia.  The glenohumeral articular cartilage was intact.  The subscapularis had extreme retraction, with tearing, and I had to mobilize it with release of the interval tissue in order to re-oppose this to the bone.  The supraspinatus and infraspinatus were torn, tissue quality was mediocre, bone quality was also mediocre.  There was significant retraction of the entirety of the rotator cuff.  I was able to mobilize this and get this re-opposed to the lateral tuberosity.  There was significant coracoacromial ligament fraying.  Moderate subacromial spurring.  OPERATIVE PROCEDURE: The patient was brought to the operating room and placed in the supine position.  General anesthesia was administered and the patient positioned in a beach chair position.  IV antibiotics were given.  The upper extremity was prepped and draped in the usual sterile fashion.  Timeout performed.  Diagnostic arthroscopy was carried out with the above named findings.    The glenoid labrum was debrided anteriorly and superiorly.    The stump of the biceps was also debrided, and it looked like there was a large amount of soft tissue that was still attached to the tuberosity anteriorly which I believe was to some degree the biceps stump and also some degree of cuff which I debrided.  I released the rotator interval tissue, mobilized the subscapularis, prepared the lesser tuberosity with a bur, placed 2 anterior cannulas, and then placed a horizontal mattress fiber tape through the subscapularis and brought that down to the anterior  bone into an anchor.  I debrided the undersurface of the cuff as well as prepared the medial edge of the tuberosity for reimplantation using the  shaver.  I went to the subacromial space, performed a complete bursectomy, subacromial CA ligament release, with a acromioplasty.  I evaluated the tear from viewing laterally, used the shaver from posterior laterally, debrided the tear as well as the bony footprint, and prepared the tendon for reinsertion.  The tendon had a tendency to be very superior, almost as if it were adhesed to the undersurface of the acromial tissue.  I placed 2 anchors from above using an anterior and posterior portal with percutaneous placement for the medial row preloaded with fiber tape in each anchor.  The first punch into the posterior anchor was in a less than ideal line, and the inserter bent slightly, so I remove the inserter, and placed the punch in a new trajectory, and then had excellent position and fixation.  I passed the sutures using a scorpion suture passer from front to back, and then I tied down the FiberWire sutures from the medial row.  Then brought these along with the fiber tape into lateral anchors.  Excellent fixation and reduction of the tendon was achieved.  I touched up the acromioplasty viewing from lateral portal.  The instruments were removed, the portals closed with Monocryl followed by Steri-Strips and sterile gauze.  The patient was awakened and returned to the PACU in stable and satisfactory condition.  There were no complications and He tolerated the procedure well.

## 2019-05-25 NOTE — Anesthesia Postprocedure Evaluation (Signed)
Anesthesia Post Note  Patient: Kyle Greene.  Procedure(s) Performed: SHOULDER ARTHROSCOPY WITH ROTATOR CUFF REPAIR AND SUBACROMIAL DECOMPRESSION (Right Shoulder)     Patient location during evaluation: PACU Anesthesia Type: General Level of consciousness: awake Pain management: pain level controlled Vital Signs Assessment: post-procedure vital signs reviewed and stable Respiratory status: spontaneous breathing Cardiovascular status: stable Postop Assessment: no apparent nausea or vomiting Anesthetic complications: no    Last Vitals:  Vitals:   05/25/19 1530 05/25/19 1545  BP: 130/67 133/79  Pulse: 80 80  Resp: 19 16  Temp:    SpO2: 99% 99%    Last Pain:  Vitals:   05/25/19 1545  TempSrc:   PainSc: 0-No pain                 [name]

## 2019-05-25 NOTE — Transfer of Care (Signed)
Immediate Anesthesia Transfer of Care Note  Patient: Kyle Greene.  Procedure(s) Performed: SHOULDER ARTHROSCOPY WITH ROTATOR CUFF REPAIR AND SUBACROMIAL DECOMPRESSION (Right Shoulder)  Patient Location: PACU  Anesthesia Type:GA combined with regional for post-op pain  Level of Consciousness: sedated  Airway & Oxygen Therapy: Patient Spontanous Breathing and Patient connected to face mask oxygen  Post-op Assessment: Report given to RN and Post -op Vital signs reviewed and stable  Post vital signs: Reviewed and stable  Last Vitals:  Vitals Value Taken Time  BP 123/79 05/25/2019  3:29 PM  Temp    Pulse 80 05/25/2019  3:30 PM  Resp 19 05/25/2019  3:30 PM  SpO2 99 % 05/25/2019  3:30 PM  Vitals shown include unvalidated device data.  Last Pain:  Vitals:   05/25/19 1049  TempSrc: Oral  PainSc: 0-No pain         Complications: No apparent anesthesia complications

## 2019-05-25 NOTE — H&P (Signed)
PREOPERATIVE H&P  Chief Complaint: Right shoulder pain  HPI: Kyle Greene. is a 78 y.o. male who presents for preoperative history and physical with a diagnosis of right shoulder full-thickness rotator cuff tear. Symptoms are rated as moderate to severe, and have been worsening.  This is significantly impairing activities of daily living.  He has elected for surgical management.  He is failed injections, activity modification, has difficulty with lifting, cannot use a hammer anymore, wakes him up at nighttime.  He has had physical therapy as well without improvement.  Past Medical History:  Diagnosis Date  . AAA (abdominal aortic aneurysm) ([place])   . Arm pain   . GERD (gastroesophageal reflux disease)   . Gouty arthropathy   . Hearing loss   . High blood pressure   . Hyperlipemia   . Low back pain   . Over weight   . Reflux esophagitis    Past Surgical History:  Procedure Laterality Date  . ABDOMINAL AORTIC ANEURYSM REPAIR    . BACK SURGERY     x2  . CATARACT EXTRACTION W/PHACO Left 01/05/2018   Procedure: CATARACT EXTRACTION PHACO AND INTRAOCULAR LENS PLACEMENT (I[facility]);  Surgeon: Tonny Branch, MD;  Location: [facility];  Service: Ophthalmology;  Laterality: Left;  CDE: 14.41  . CATARACT EXTRACTION W/PHACO Right 02/16/2018   Procedure: CATARACT EXTRACTION PHACO AND INTRAOCULAR LENS PLACEMENT (I[facility]);  Surgeon: Tonny Branch, MD;  Location: [facility];  Service: Ophthalmology;  Laterality: Right;  CDE: 6.18  . COLONOSCOPY W/ BIOPSIES AND POLYPECTOMY    . LUMBAR LAMINECTOMY/DECOMPRESSION MICRODISCECTOMY Right 06/15/2014   Procedure: LUMBAR THREE TO FOUR, LUMBAR FOUR TO FIVE LUMBAR LAMINECTOMY/DECOMPRESSION MICRODISCECTOMY 2 LEVELS;  Surgeon: Floyce Stakes, MD;  Location: [place] [facility];  Service: Neurosurgery;  Laterality: Right;  right L3-4 L4-5 Microdiskectomy   Social History   Socioeconomic History  . Marital status: Married    Spouse name: Iris  . Number of children: 1  . Years of  education: 73  . Highest education level: Not on file  Occupational History    Employer: [employer]  Social Needs  . Financial resource strain: Not on file  . Food insecurity:    Worry: Not on file    Inability: Not on file  . Transportation needs:    Medical: Not on file    Non-medical: Not on file  Tobacco Use  . Smoking status: Former Smoker    Types: Cigarettes    Last attempt to quit: 1997    Years since quitting: 23.4  . Smokeless tobacco: Never Used  . Tobacco comment: Quit 20 years ago.  Substance and Sexual Activity  . Alcohol use: No  . Drug use: No  . Sexual activity: Not on file  Lifestyle  . Physical activity:    Days per week: Not on file    Minutes per session: Not on file  . Stress: Not on file  Relationships  . Social connections:    Talks on phone: Not on file    Gets together: Not on file    Attends religious service: Not on file    Active member of club or organization: Not on file    Attends meetings of clubs or organizations: Not on file    Relationship status: Not on file  Other Topics Concern  . Not on file  Social History Narrative   Patient lives at home with his wife Kyle Greene)   Patient works full time Education administrator.   Right handed.  Caffeine five [place] dews daily.   Family History  Problem Relation Age of Onset  . COPD Mother   . Cancer Father    Allergies  Allergen Reactions  . Statins Other (See Comments)    Muscle soreness   Prior to Admission medications   Medication Sig Start Date End Date Taking? Authorizing Provider  acetaminophen (TYLENOL) 500 MG tablet Take 500 mg by mouth every 6 (six) hours as needed.   Yes [provider]  allopurinol (ZYLOPRIM) 300 MG tablet Take 300 mg by mouth daily.   Yes [provider]  famotidine (PEPCID) 20 MG tablet Take 20 mg by mouth daily.   Yes [provider]  hydrochlorothiazide (MICROZIDE) 12.5 MG capsule Take 12.5 mg by mouth daily.   Yes [provider]  indomethacin (INDOCIN) 50 MG capsule Take 50 mg by mouth 2 (two) times daily as needed (gout pain).    [provider]     Positive ROS: All other systems have been reviewed and were otherwise negative with the exception of those mentioned in the HPI and as above.  Physical Exam: General: Alert, no acute distress Cardiovascular: No pedal edema Respiratory: No cyanosis, no use of accessory musculature GI: No organomegaly, abdomen is soft and non-tender Skin: No lesions in the area of chief complaint Neurologic: Sensation intact distally Psychiatric: Patient is competent for consent with normal mood and affect Lymphatic: No axillary or cervical lymphadenopathy  MUSCULOSKELETAL: Right shoulder 0 to 170 degrees of motion without pain over the acromioclavicular joint.  Positive weakness in the mid range, negative Popeye sign.  Positive liftoff test.  Assessment: Right shoulder large rotator cuff tear, potentially involving the subscapularis   Plan: Plan for Procedure(s): SHOULDER ARTHROSCOPY WITH ROTATOR CUFF REPAIR AND SUBACROMIAL DECOMPRESSION DEBRIDEMENT SHOULDER, likely to need repair of both supraspinatus and possibly subscapularis, and even potentially infraspinatus.  The risks benefits and alternatives were discussed with the patient including but not limited to the risks of nonoperative treatment, versus surgical intervention including infection, bleeding, nerve injury,  blood clots, cardiopulmonary complications, morbidity, mortality, among others, and they were willing to proceed.  We have also discussed the risks for recurrent rotator cuff rupture, the progression of rotator cuff arthropathy, and the potential need for arthroplasty.    Johnny Bridge, MD Cell [phone]   05/25/2019 12:23 PM

## 2019-05-25 NOTE — Discharge Instructions (Signed)
Diet: As you were doing prior to hospitalization   Shower:  May shower but keep the wounds dry, use an occlusive plastic wrap, NO SOAKING IN TUB.  If the bandage gets wet, change with a clean dry gauze.  If you have a splint on, leave the splint in place and keep the splint dry with a plastic bag.  Dressing:  You may change your dressing 3-5 days after surgery, unless you have a splint.  If you have a splint, then just leave the splint in place and we will change your bandages during your first follow-up appointment.    If you had hand or foot surgery, we will plan to remove your stitches in about 2 weeks in the office.  For all other surgeries, there are sticky tapes (steri-strips) on your wounds and all the stitches are absorbable.  Leave the steri-strips in place when changing your dressings, they will peel off with time, usually 2-3 weeks.  Activity:  Increase activity slowly as tolerated, but follow the weight bearing instructions below.  The rules on driving is that you can not be taking narcotics while you drive, and you must feel in control of the vehicle.    Weight Bearing:   Sling at all times except hygiene.    To prevent constipation: you may use a stool softener such as -  Colace (over the counter) 100 mg by mouth twice a day  Drink plenty of fluids (prune juice may be helpful) and high fiber foods Miralax (over the counter) for constipation as needed.    Itching:  If you experience itching with your medications, try taking only a single pain pill, or even half a pain pill at a time.  You may take up to 10 pain pills per day, and you can also use benadryl over the counter for itching or also to help with sleep.   Precautions:  If you experience chest pain or shortness of breath - call 911 immediately for transfer to the hospital emergency department!!  If you develop a fever greater that 101 F, purulent drainage from wound, increased redness or drainage from wound, or calf pain --  Call the office at [phone]                                                Follow- Up Appointment:  Please call for an appointment to be seen in 2 weeks [place] - [phone]   Post Anesthesia Home Care Instructions  Activity: Get plenty of rest for the remainder of the day. A responsible individual must stay with you for 24 hours following the procedure.  For the next 24 hours, DO NOT: -Drive a car -Paediatric nurse -Drink alcoholic beverages -Take any medication unless instructed by your physician -Make any legal decisions or sign important papers.  Meals: Start with liquid foods such as gelatin or soup. Progress to regular foods as tolerated. Avoid greasy, spicy, heavy foods. If nausea and/or vomiting occur, drink only clear liquids until the nausea and/or vomiting subsides. Call your physician if vomiting continues.  Special Instructions/Symptoms: Your throat may feel dry or sore from the anesthesia or the breathing tube placed in your throat during surgery. If this causes discomfort, gargle with warm salt water. The discomfort should disappear within 24 hours.  If you had a scopolamine patch placed behind your ear for the  management of post- operative nausea and/or vomiting:  1. The medication in the patch is effective for 72 hours, after which it should be removed.  Wrap patch in a tissue and discard in the trash. Wash hands thoroughly with soap and water. 2. You may remove the patch earlier than 72 hours if you experience unpleasant side effects which may include dry mouth, dizziness or visual disturbances. 3. Avoid touching the patch. Wash your hands with soap and water after contact with the patch.    Regional Anesthesia Blocks  1. Numbness or the inability to move the "blocked" extremity may last from 3-48 hours after placement. The length of time depends on the medication injected and your individual response to the medication. If the numbness is not going away after  48 hours, call your surgeon.  2. The extremity that is blocked will need to be protected until the numbness is gone and the  Strength has returned. Because you cannot feel it, you will need to take extra care to avoid injury. Because it may be weak, you may have difficulty moving it or using it. You may not know what position it is in without looking at it while the block is in effect.  3. For blocks in the legs and feet, returning to weight bearing and walking needs to be done carefully. You will need to wait until the numbness is entirely gone and the strength has returned. You should be able to move your leg and foot normally before you try and bear weight or walk. You will need someone to be with you when you first try to ensure you do not fall and possibly risk injury.  4. Bruising and tenderness at the needle site are common side effects and will resolve in a few days.  5. Persistent numbness or new problems with movement should be communicated to the surgeon or the [facility] [phone]).  Information for Discharge Teaching: EXPAREL (bupivacaine liposome injectable suspension)   Your surgeon or anesthesiologist gave you EXPAREL(bupivacaine) to help control your pain after surgery.   EXPAREL is a local anesthetic that provides pain relief by numbing the tissue around the surgical site.  EXPAREL is designed to release pain medication over time and can control pain for up to 72 hours.  Depending on how you respond to EXPAREL, you may require less pain medication during your recovery.  Possible side effects:  Temporary loss of sensation or ability to move in the area where bupivacaine was injected.  Nausea, vomiting, constipation  Rarely, numbness and tingling in your mouth or lips, lightheadedness, or anxiety may occur.  Call your doctor right away if you think you may be experiencing any of these sensations, or if you have  other questions regarding possible side effects.  Follow all other discharge instructions given to you by your surgeon or nurse. Eat a healthy diet and drink plenty of water or other fluids.  If you return to the hospital for any reason within 96 hours following the administration of EXPAREL, it is important for health care providers to know that you have received this anesthetic. A teal colored band has been placed on your arm with the date, time and amount of EXPAREL you have received in order to alert and inform your health care providers. Please leave this armband in place for the full 96 hours following administration, and then you may remove the band.

## 2019-05-26 ENCOUNTER — Encounter (HOSPITAL_BASED_OUTPATIENT_CLINIC_OR_DEPARTMENT_OTHER): Payer: Self-pay | Admitting: Orthopedic Surgery

## 2019-05-29 IMAGING — MR MR SHOULDER*R* W/O CM
4 of 5 series · 28 of 40 positions shown · non-contrast
Comparison: Radiographs 08/19/2018

CLINICAL DATA: Right shoulder pain for 2 months.

EXAM:
MRI OF THE RIGHT SHOULDER WITHOUT CONTRAST
TECHNIQUE: Multiplanar, multisequence MR imaging of the shoulder was performed.
No intravenous contrast was administered.

[Series 4: PD fat-sat · axial · 4.0mm · 0.27mm/px · z∈[-23,+67]mm · 8 of 20 slices shown (1 of 2)]
[im 1/20]
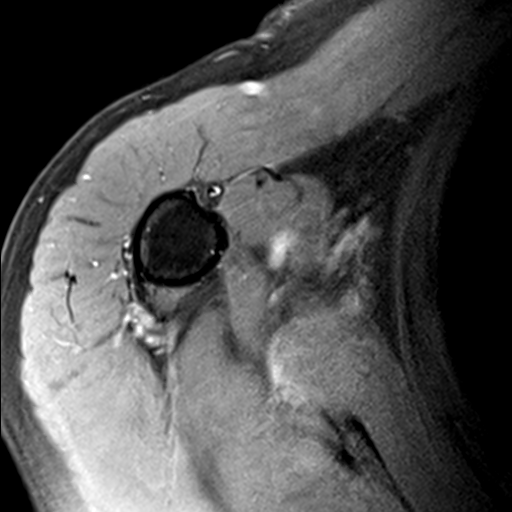
[im 3/20]
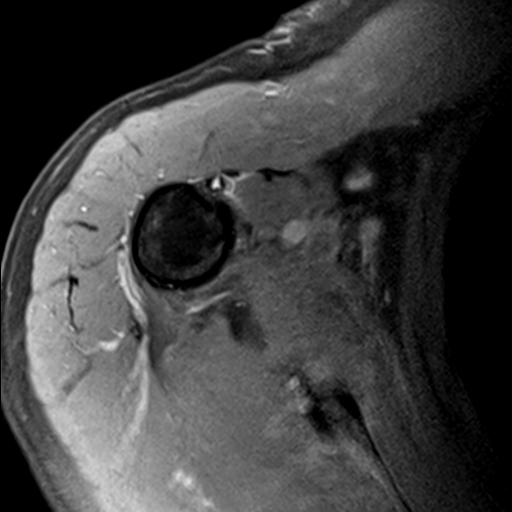
[im 6/20]
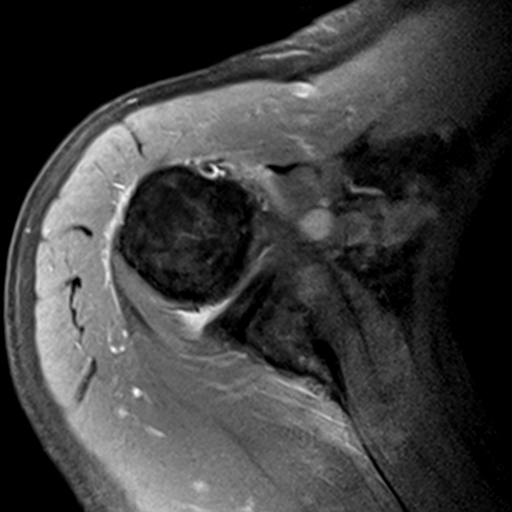
[im 9/20]
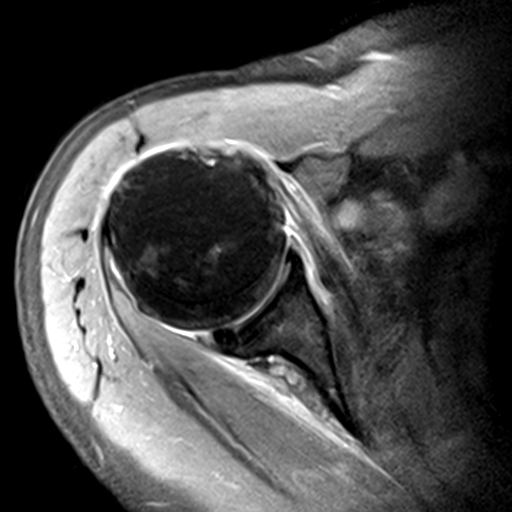
[im 11/20]
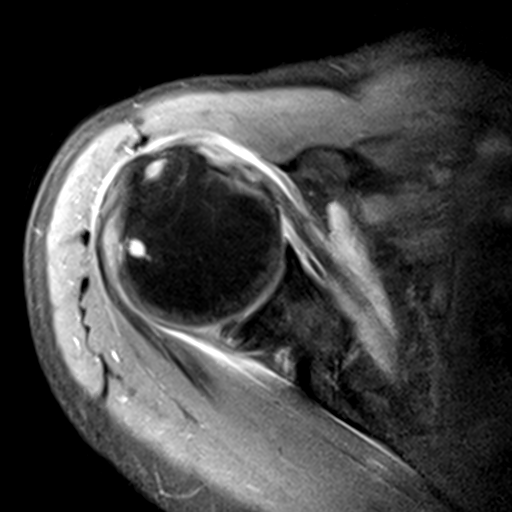
[im 14/20]
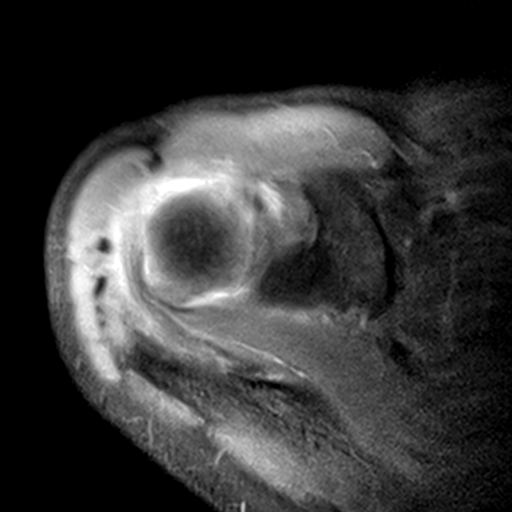
[im 17/20]
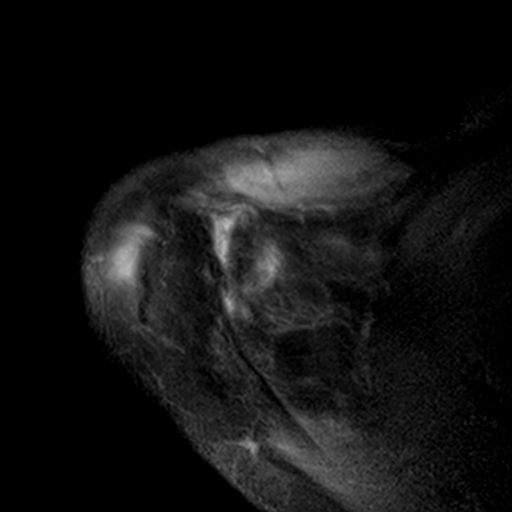
[im 20/20]
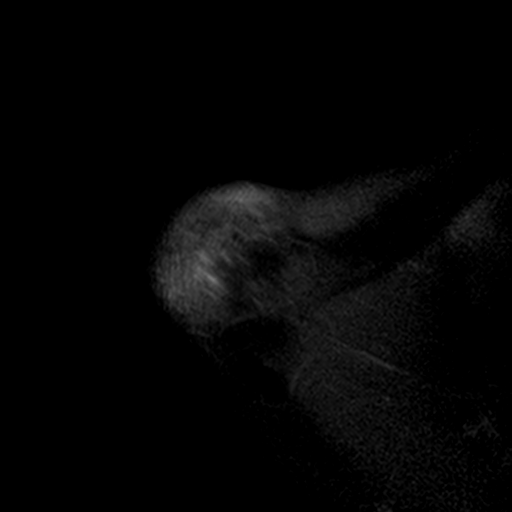

[Series 7: T2 fat-sat · oblique · 4.0mm · 0.55mm/px · 7 of 15 slices shown (1 of 2)]
[im 1/15]
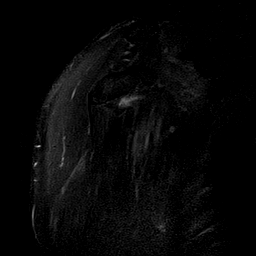
[im 3/15]
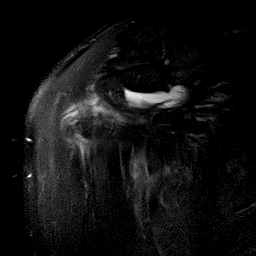
[im 5/15]
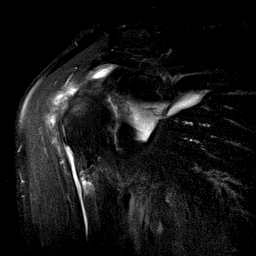
[im 8/15]
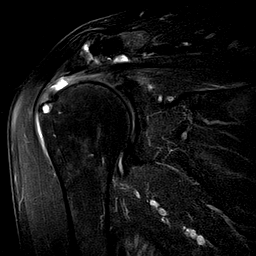
[im 10/15]
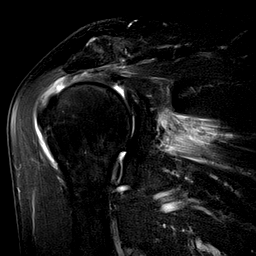
[im 12/15]
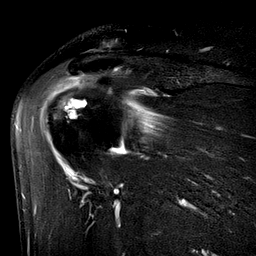
[im 15/15]
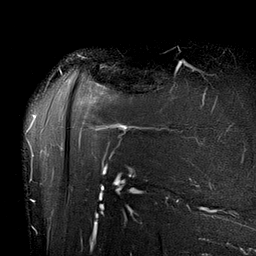

[Series 8: PD fat-sat · oblique · 4.0mm · 0.27mm/px · 7 of 15 slices shown (2 of 2)]
[im 1/15]
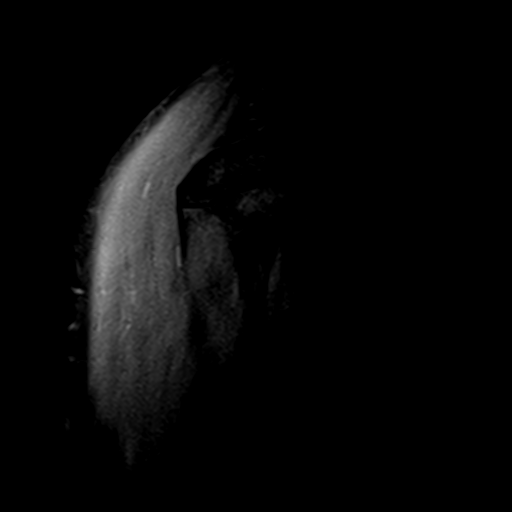
[im 3/15]
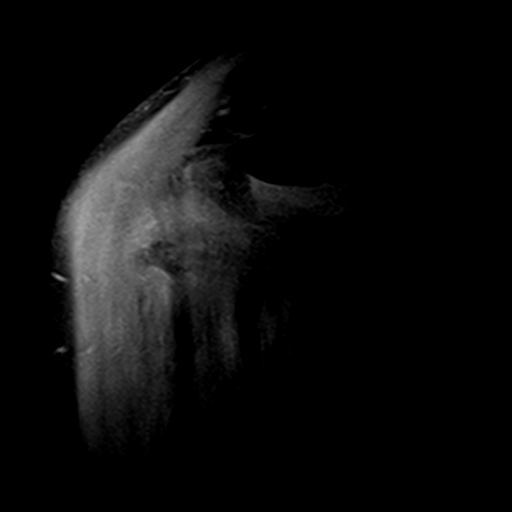
[im 5/15]
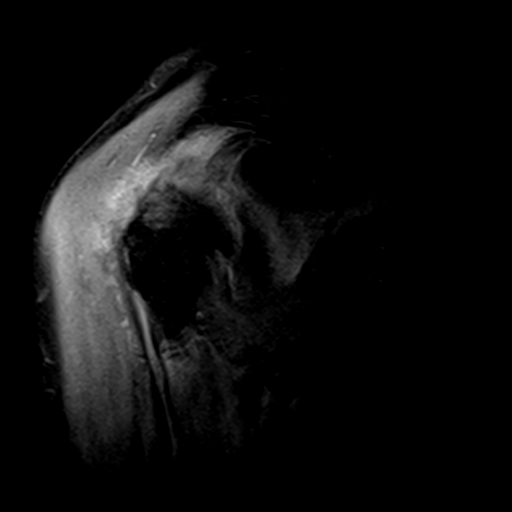
[im 8/15]
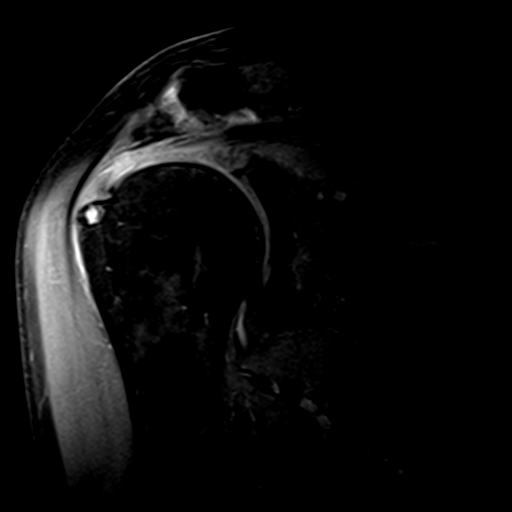
[im 10/15]
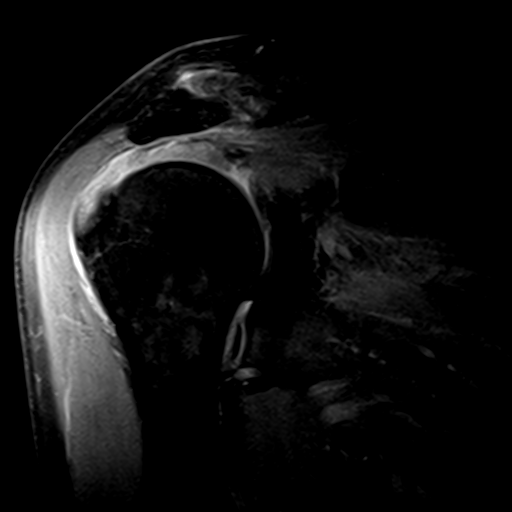
[im 12/15]
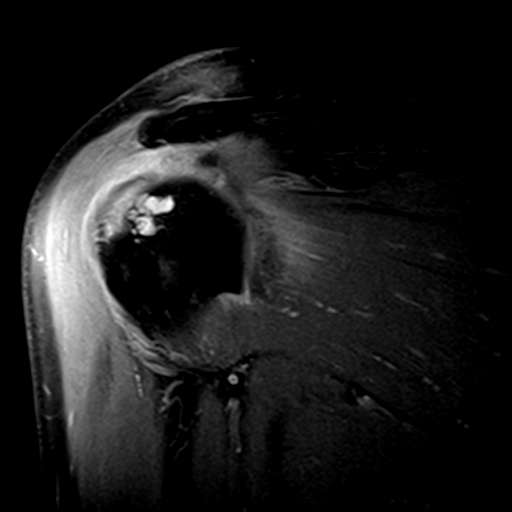
[im 15/15]
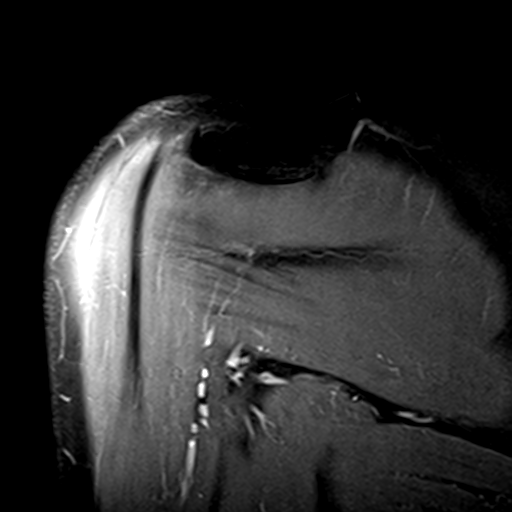

[Series 9: T2 fat-sat · oblique · 4.0mm · 0.55mm/px · 6 of 19 slices shown (2 of 2)]
[im 1/19]
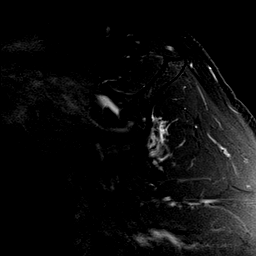
[im 3/19]
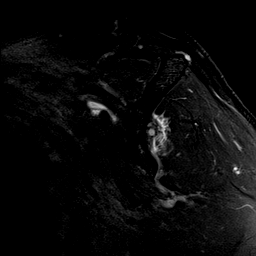
[im 5/19]
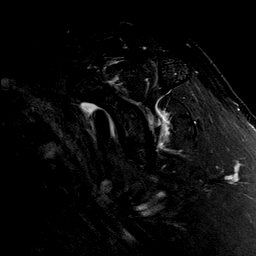
[im 7/19]
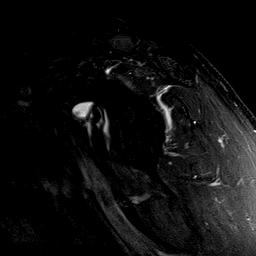
[im 10/19]
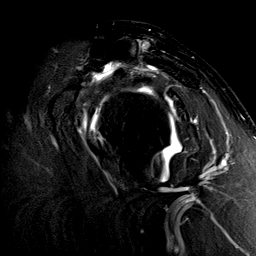
[im 16/19]
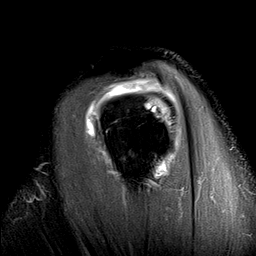

[28 of 40 positions shown; findings below may reference images not displayed]

FINDINGS: Rotator cuff: Large full-thickness retracted supraspinatus tendon
tear. Maximum retraction is approximately 2 cm. The tear is
approximately 2 cm wide. The infraspinatus tendon is intact and
demonstrates moderate tendinopathy. The upper fibers of the
subscapularis tendon are torn and retracted. The mid and lower
fibers are intact.

Muscles: Mild fatty atrophy of the supraspinatus and subscapularis
muscles.

Biceps long head:  Torn and retracted.

Acromioclavicular Joint: Moderate degenerative changes type 2
acromion. No significant lateral downsloping or undersurface
spurring.

Glenohumeral Joint: Moderate degenerative changes with degenerative
chondrosis and early spurring. Small joint effusion without
synovitis.

Labrum:  No obvious labral tear.

Bones: No acute bony findings. Subchondral cystic changes noted in
the humeral head likely degenerative changes and intraosseous
ganglion.

Other: Expected fluid in the subacromial/subdeltoid bursa and also
in the subcoracoid bursa.
IMPRESSION: 1. Large full-thickness retracted supraspinatus tendon tear. Some of
the upper fibers of the subscapularis tendon are also torn.
2. Torn and retracted long head biceps tendon.
3. No definite labral tears.
4. Moderate glenohumeral joint degenerative changes with small joint
effusion.

## 2019-06-07 DIAGNOSIS — S46011D Strain of muscle(s) and tendon(s) of the rotator cuff of right shoulder, subsequent encounter: Secondary | ICD-10-CM | POA: Diagnosis not present

## 2019-07-05 DIAGNOSIS — S46011D Strain of muscle(s) and tendon(s) of the rotator cuff of right shoulder, subsequent encounter: Secondary | ICD-10-CM | POA: Diagnosis not present

## 2019-07-12 DIAGNOSIS — M25611 Stiffness of right shoulder, not elsewhere classified: Secondary | ICD-10-CM | POA: Diagnosis not present

## 2019-07-12 DIAGNOSIS — M6281 Muscle weakness (generalized): Secondary | ICD-10-CM | POA: Diagnosis not present

## 2019-07-12 DIAGNOSIS — M75121 Complete rotator cuff tear or rupture of right shoulder, not specified as traumatic: Secondary | ICD-10-CM | POA: Diagnosis not present

## 2019-07-12 DIAGNOSIS — M25511 Pain in right shoulder: Secondary | ICD-10-CM | POA: Diagnosis not present

## 2019-07-13 DIAGNOSIS — Z0000 Encounter for general adult medical examination without abnormal findings: Secondary | ICD-10-CM | POA: Diagnosis not present

## 2019-07-13 DIAGNOSIS — K219 Gastro-esophageal reflux disease without esophagitis: Secondary | ICD-10-CM | POA: Diagnosis not present

## 2019-07-13 DIAGNOSIS — M109 Gout, unspecified: Secondary | ICD-10-CM | POA: Diagnosis not present

## 2019-07-13 DIAGNOSIS — I10 Essential (primary) hypertension: Secondary | ICD-10-CM | POA: Diagnosis not present

## 2019-07-19 ENCOUNTER — Encounter (HOSPITAL_COMMUNITY): Payer: Self-pay | Admitting: Emergency Medicine

## 2019-07-19 ENCOUNTER — Other Ambulatory Visit: Payer: Self-pay

## 2019-07-19 ENCOUNTER — Emergency Department (HOSPITAL_COMMUNITY)
Admission: EM | Admit: 2019-07-19 | Discharge: 2019-07-19 | Disposition: A | Discharge status: Home / Self Care | Payer: PPO | Attending: Emergency Medicine | Admitting: Emergency Medicine

## 2019-07-19 DIAGNOSIS — Z79899 Other long term (current) drug therapy: Secondary | ICD-10-CM | POA: Insufficient documentation

## 2019-07-19 DIAGNOSIS — Z87891 Personal history of nicotine dependence: Secondary | ICD-10-CM | POA: Diagnosis not present

## 2019-07-19 DIAGNOSIS — R634 Abnormal weight loss: Secondary | ICD-10-CM | POA: Diagnosis not present

## 2019-07-19 DIAGNOSIS — R112 Nausea with vomiting, unspecified: Secondary | ICD-10-CM

## 2019-07-19 DIAGNOSIS — R05 Cough: Secondary | ICD-10-CM | POA: Diagnosis not present

## 2019-07-19 DIAGNOSIS — R1110 Vomiting, unspecified: Secondary | ICD-10-CM | POA: Diagnosis present

## 2019-07-19 DIAGNOSIS — R059 Cough, unspecified: Secondary | ICD-10-CM

## 2019-07-19 DIAGNOSIS — U071 COVID-19: Secondary | ICD-10-CM | POA: Insufficient documentation

## 2019-07-19 DIAGNOSIS — R197 Diarrhea, unspecified: Secondary | ICD-10-CM

## 2019-07-19 LAB — COMPREHENSIVE METABOLIC PANEL
ALT: 13 U/L (ref 0–44)
AST: 37 U/L (ref 15–41)
Albumin: 3.7 g/dL (ref 3.5–5.0)
Alkaline Phosphatase: 67 U/L (ref 38–126)
Anion gap: 14 (ref 5–15)
BUN: 23 mg/dL (ref 8–23)
CO2: 24 mmol/L (ref 22–32)
Calcium: 9 mg/dL (ref 8.9–10.3)
Chloride: 98 mmol/L (ref 98–111)
Creatinine, Ser: 1.18 mg/dL (ref 0.61–1.24)
GFR calc Af Amer: >60 mL/min (ref >60)
GFR calc non Af Amer: 59 mL/min — ABNORMAL LOW (ref >60)
Glucose, Bld: 93 mg/dL (ref 70–99)
Potassium: 3.8 mmol/L (ref 3.5–5.1)
Sodium: 136 mmol/L (ref 135–145)
Total Bilirubin: 0.8 mg/dL (ref 0.3–1.2)
Total Protein: 7.2 g/dL (ref 6.5–8.1)

## 2019-07-19 LAB — URINALYSIS, ROUTINE W REFLEX MICROSCOPIC
Bilirubin Urine: NEGATIVE
Glucose, UA: NEGATIVE mg/dL
Hgb urine dipstick: NEGATIVE
Ketones, ur: 20 mg/dL — AB
Leukocytes,Ua: NEGATIVE
Nitrite: NEGATIVE
Protein, ur: 30 mg/dL — AB
Specific Gravity, Urine: 1.024 (ref 1.005–1.030)
pH: 5 (ref 5.0–8.0)

## 2019-07-19 LAB — LIPASE, BLOOD: Lipase: 35 U/L (ref 11–51)

## 2019-07-19 LAB — CBC WITH DIFFERENTIAL/PLATELET
Abs Immature Granulocytes: 0.03 10*3/uL (ref 0.00–0.07)
Basophils Absolute: 0 10*3/uL (ref 0.0–0.1)
Basophils Relative: 0 %
Eosinophils Absolute: 0 10*3/uL (ref 0.0–0.5)
Eosinophils Relative: 0 %
HCT: 41 % (ref 39.0–52.0)
Hemoglobin: 13.5 g/dL (ref 13.0–17.0)
Immature Granulocytes: 1 %
Lymphocytes Relative: 18 %
Lymphs Abs: 1.2 10*3/uL (ref 0.7–4.0)
MCH: 30.6 pg (ref 26.0–34.0)
MCHC: 32.9 g/dL (ref 30.0–36.0)
MCV: 93 fL (ref 80.0–100.0)
Monocytes Absolute: 0.9 10*3/uL (ref 0.1–1.0)
Monocytes Relative: 13 %
Neutro Abs: 4.4 10*3/uL (ref 1.7–7.7)
Neutrophils Relative %: 68 %
Platelets: 167 10*3/uL (ref 150–400)
RBC: 4.41 MIL/uL (ref 4.22–5.81)
RDW: 13.7 % (ref 11.5–15.5)
WBC: 6.5 10*3/uL (ref 4.0–10.5)
nRBC: 0 % (ref 0.0–0.2)

## 2019-07-19 MED ORDER — ONDANSETRON HCL 4 MG PO TABS: 4.0000 mg | ORAL_TABLET | Freq: Three times a day (TID) | ORAL | 0 refills | Status: AC | PRN

## 2019-07-19 MED ORDER — SODIUM CHLORIDE 0.9% FLUSH: 3.0000 mL | Freq: Once | INTRAVENOUS | Status: DC

## 2019-07-19 NOTE — ED Notes (Signed)
Patient states he can't eat, everytime he tries to eat he gets nauseated. States he hasn't eaten in 2 weeks.

## 2019-07-19 NOTE — ED Provider Notes (Signed)
[facility] EMERGENCY DEPARTMENT Provider Note   CSN: [id number] Arrival date & time: 07/19/19  1205    History   Chief Complaint Chief Complaint  Patient presents with  . Diarrhea  . Nausea    HPI Kyle Wert. is a 78 y.o. male.     HPI Reports two weeks of decreased appetite, several episodes of emesis and diarrhea but this has been limited by lack of PO intake. One week of nonproductive cough now resolved over the last week. Wife has had the same symptoms but her appetite has returned while his has not. Reports he has had little to no solid food over the past week and is concerned he is losing weight and not improving.   Past Medical History:  Diagnosis Date  . AAA (abdominal aortic aneurysm) ([place])   . Arm pain   . GERD (gastroesophageal reflux disease)   . Gouty arthropathy   . Hearing loss   . High blood pressure   . Hyperlipemia   . Low back pain   . Over weight   . Reflux esophagitis   . Right rotator cuff tear 05/25/2019    Patient Active Problem List   Diagnosis Date Noted  . Right rotator cuff tear 05/25/2019  . Nontraumatic tear of right supraspinatus tendon 10/09/2018  . Tendinopathy of left rotator cuff 10/09/2018  . Nontraumatic rupture of long head of biceps tendon of right shoulder 10/09/2018  . 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 Right lumbar radiculopathy 05/03/2014  . H/O aortic aneurysm repair 05/03/2014  . Low back pain   . Arm pain   . Reflux esophagitis   . Gouty arthropathy   . Hearing loss   . Hyperlipemia   . Over weight     Past Surgical History:  Procedure Laterality Date  . ABDOMINAL AORTIC ANEURYSM REPAIR    . BACK SURGERY     x2  . CATARACT EXTRACTION W/PHACO Left 01/05/2018   Procedure: CATARACT EXTRACTION PHACO AND INTRAOCULAR LENS PLACEMENT (IOC);  Surgeon: Tonny Branch, MD;  Location: [facility];  Service: Ophthalmology;  Laterality: Left;  CDE: 14.41  . CATARACT EXTRACTION W/PHACO Right 02/16/2018   Procedure: CATARACT EXTRACTION PHACO AND INTRAOCULAR LENS PLACEMENT (IOC);  Surgeon: Tonny Branch, MD;  Location: [facility];  Service: Ophthalmology;  Laterality: Right;  CDE: 6.18  . COLONOSCOPY W/ BIOPSIES AND POLYPECTOMY    . LUMBAR LAMINECTOMY/DECOMPRESSION MICRODISCECTOMY Right 06/15/2014   Procedure: LUMBAR THREE TO FOUR, LUMBAR FOUR TO FIVE LUMBAR LAMINECTOMY/DECOMPRESSION MICRODISCECTOMY 2 LEVELS;  Surgeon: Floyce Stakes, MD;  Location: [facility];  Service: Neurosurgery;  Laterality: Right;  right L3-4 L4-5 Microdiskectomy  . SHOULDER ARTHROSCOPY WITH ROTATOR CUFF REPAIR AND SUBACROMIAL DECOMPRESSION Right 05/25/2019   Procedure: SHOULDER ARTHROSCOPY WITH ROTATOR CUFF REPAIR AND SUBACROMIAL DECOMPRESSION;  Surgeon: Marchia Bond, MD;  Location: [facility];  Service: Orthopedics;  Laterality: Right;        Home Medications    Prior to Admission medications   Medication Sig Start Date End Date Taking? Authorizing Provider  allopurinol (ZYLOPRIM) 300 MG tablet Take 300 mg by mouth daily.    [provider]  baclofen (LIORESAL) 10 MG tablet Take 1 tablet (10 mg total) by mouth 3 (three) times daily. As needed for muscle spasm 05/25/19   Marchia Bond, MD  famotidine (PEPCID) 20 MG tablet Take 20 mg by mouth daily.    [provider]  hydrochlorothiazide (MICROZIDE) 12.5 MG capsule Take 12.5 mg by  mouth daily.    [provider]  HYDROcodone-acetaminophen (NORCO) 10-325 MG tablet Take 1 tablet by mouth every 6 (six) hours as needed. 05/25/19   Marchia Bond, MD  indomethacin (INDOCIN) 50 MG capsule Take 50 mg by mouth 2 (two) times daily as needed (gout pain).    [provider]  ondansetron (ZOFRAN) 4 MG tablet Take 1 tablet (4 mg total) by mouth every 8 (eight) hours as needed for nausea or vomiting. 05/25/19   Marchia Bond, MD  ondansetron (ZOFRAN) 4 MG tablet Take 1 tablet (4 mg total) by mouth every 8 (eight) hours as needed for up to 5  days for nausea or vomiting. 07/19/19 07/24/19  Tillie Fantasia, MD  sennosides-docusate sodium (SENOKOT-S) 8.6-50 MG tablet Take 2 tablets by mouth daily. 05/25/19   Marchia Bond, MD    Family History Family History  Problem Relation Age of Onset  . COPD Mother   . Cancer Father     Social History Social History   Tobacco Use  . Smoking status: Former Smoker    Types: Cigarettes    Quit date: 1997    Years since quitting: 23.5  . Smokeless tobacco: Never Used  . Tobacco comment: Quit 20 years ago.  Substance Use Topics  . Alcohol use: No  . Drug use: No     Allergies   Statins   Review of Systems Review of Systems  Constitutional: Positive for chills.  Respiratory: Positive for cough.   Gastrointestinal: Positive for diarrhea, nausea and vomiting. Negative for abdominal pain.  All other systems reviewed and are negative.    Physical Exam Updated Vital Signs BP (!) 117/57 (BP Location: Right Arm)   Pulse 90   Temp 99.6 F (37.6 C) (Oral)   Resp 16   SpO2 96%   Physical Exam Vitals signs and nursing note reviewed.  Constitutional:      Appearance: He is well-developed.  HENT:     Head: Normocephalic and atraumatic.  Eyes:     Conjunctiva/sclera: Conjunctivae normal.  Neck:     Musculoskeletal: Neck supple.  Cardiovascular:     Rate and Rhythm: Normal rate and regular rhythm.     Heart sounds: No murmur.  Pulmonary:     Effort: Pulmonary effort is normal. No respiratory distress.     Breath sounds: Normal breath sounds.  Abdominal:     Palpations: Abdomen is soft.     Tenderness: There is no abdominal tenderness.  Skin:    General: Skin is warm and dry.  Neurological:     Mental Status: He is alert.      ED Treatments / Results  Labs (all labs ordered are listed, but only abnormal results are displayed) Labs Reviewed  COMPREHENSIVE METABOLIC PANEL - Abnormal; Notable for the following components:      Result Value   GFR calc non Af Amer 59  (*)    All other components within normal limits  URINALYSIS, ROUTINE W REFLEX MICROSCOPIC - Abnormal; Notable for the following components:   Color, Urine AMBER (*)    Ketones, ur 20 (*)    Protein, ur 30 (*)    Bacteria, UA RARE (*)    All other components within normal limits  NOVEL CORONAVIRUS, NAA (HOSPITAL ORDER, SEND-OUT TO REF LAB)  LIPASE, BLOOD  CBC WITH DIFFERENTIAL/PLATELET    EKG None  Radiology No results found.  Procedures Procedures (including critical care time)  Medications Ordered in ED Medications  sodium chloride flush (NS)  0.9 % injection 3 mL (has no administration in time range)     Initial Impression / Assessment and Plan / ED Course  I have reviewed the triage vital signs and the nursing notes.  Pertinent labs & imaging results that were available during my care of the patient were reviewed by me and considered in my medical decision making (see chart for details).        CBC, BMP, Lipase all within normal limits. No sign of severe dehydration or AKI. UA does not suggest UTI. Benign exam. With wife's similar symptoms, suspect infectious etiology. Outpatient COVID ordered. Zofran prescription provided. [place] for discharge and close follow up with PCP.   Patient vocalized understanding and agreement with plan. Hand no other questions or concerns. Was given relevant verbal and written information regarding aftercare and return precautions and discharged in good condition.   Kyle Salton. was evaluated in Emergency Department on 07/19/2019 for the symptoms described in the history of present illness. He was evaluated in the context of the global COVID-19 pandemic, which necessitated consideration that the patient might be at risk for infection with the SARS-CoV-2 virus that causes COVID-19. Institutional protocols and algorithms that pertain to the evaluation of patients at risk for COVID-19 are in a state of rapid change based on information released  by regulatory bodies including the CDC and federal and state organizations. These policies and algorithms were followed during the patient's care in the ED.   Final Clinical Impressions(s) / ED Diagnoses   Final diagnoses:  Nausea vomiting and diarrhea  Cough  Weight loss, unintentional    ED Discharge Orders         Ordered    ondansetron (ZOFRAN) 4 MG tablet  Every 8 hours PRN     07/19/19 1443           Tillie Fantasia, MD 07/19/19 [place], [place], MD 07/20/19 [phone]

## 2019-07-19 NOTE — ED Triage Notes (Signed)
Pt repots nausea, vomiting, diarrhea, and loss of appetite x 2weeks. Pt denies abdominal pain. Pt reports he hasn't been able to keep much food/fluid down.

## 2019-07-21 DIAGNOSIS — R634 Abnormal weight loss: Secondary | ICD-10-CM | POA: Diagnosis not present

## 2019-07-21 DIAGNOSIS — U071 COVID-19: Secondary | ICD-10-CM | POA: Diagnosis not present

## 2019-07-21 DIAGNOSIS — R110 Nausea: Secondary | ICD-10-CM | POA: Diagnosis not present

## 2019-07-21 LAB — NOVEL CORONAVIRUS, NAA (HOSP ORDER, SEND-OUT TO REF LAB; TAT 18-24 HRS): SARS-CoV-2, NAA: DETECTED — AB

## 2019-07-25 ENCOUNTER — Encounter (HOSPITAL_COMMUNITY): Payer: Self-pay

## 2019-07-25 ENCOUNTER — Other Ambulatory Visit: Payer: Self-pay

## 2019-07-25 ENCOUNTER — Emergency Department (HOSPITAL_COMMUNITY)
Admission: EM | Admit: 2019-07-25 | Discharge: 2019-07-25 | Disposition: A | Discharge status: Home / Self Care | Payer: PPO | Attending: Emergency Medicine | Admitting: Emergency Medicine

## 2019-07-25 DIAGNOSIS — R630 Anorexia: Secondary | ICD-10-CM | POA: Insufficient documentation

## 2019-07-25 DIAGNOSIS — Z87891 Personal history of nicotine dependence: Secondary | ICD-10-CM | POA: Insufficient documentation

## 2019-07-25 DIAGNOSIS — R110 Nausea: Secondary | ICD-10-CM | POA: Diagnosis not present

## 2019-07-25 DIAGNOSIS — R531 Weakness: Secondary | ICD-10-CM | POA: Diagnosis present

## 2019-07-25 DIAGNOSIS — U071 COVID-19: Secondary | ICD-10-CM | POA: Insufficient documentation

## 2019-07-25 DIAGNOSIS — Z79899 Other long term (current) drug therapy: Secondary | ICD-10-CM | POA: Diagnosis not present

## 2019-07-25 LAB — CBC
HCT: 42.1 % (ref 39.0–52.0)
Hemoglobin: 14 g/dL (ref 13.0–17.0)
MCH: 30.6 pg (ref 26.0–34.0)
MCHC: 33.3 g/dL (ref 30.0–36.0)
MCV: 91.9 fL (ref 80.0–100.0)
Platelets: 225 10*3/uL (ref 150–400)
RBC: 4.58 MIL/uL (ref 4.22–5.81)
RDW: 13.7 % (ref 11.5–15.5)
WBC: 11.8 10*3/uL — ABNORMAL HIGH (ref 4.0–10.5)
nRBC: 0 % (ref 0.0–0.2)

## 2019-07-25 LAB — COMPREHENSIVE METABOLIC PANEL
ALT: 28 U/L (ref 0–44)
AST: 56 U/L — ABNORMAL HIGH (ref 15–41)
Albumin: 3 g/dL — ABNORMAL LOW (ref 3.5–5.0)
Alkaline Phosphatase: 52 U/L (ref 38–126)
Anion gap: 9 (ref 5–15)
BUN: 36 mg/dL — ABNORMAL HIGH (ref 8–23)
CO2: 26 mmol/L (ref 22–32)
Calcium: 9.2 mg/dL (ref 8.9–10.3)
Chloride: 102 mmol/L (ref 98–111)
Creatinine, Ser: 1.23 mg/dL (ref 0.61–1.24)
GFR calc Af Amer: >60 mL/min (ref >60)
GFR calc non Af Amer: 56 mL/min — ABNORMAL LOW (ref >60)
Glucose, Bld: 133 mg/dL — ABNORMAL HIGH (ref 70–99)
Potassium: 4.1 mmol/L (ref 3.5–5.1)
Sodium: 137 mmol/L (ref 135–145)
Total Bilirubin: 1 mg/dL (ref 0.3–1.2)
Total Protein: 6.9 g/dL (ref 6.5–8.1)

## 2019-07-25 MED ORDER — SODIUM CHLORIDE 0.9% FLUSH: 3.0000 mL | Freq: Once | INTRAVENOUS | Status: DC

## 2019-07-25 MED ORDER — SODIUM CHLORIDE 0.9 % IV BOLUS
1000.0000 mL | Freq: Once | INTRAVENOUS | Status: AC
  Administered 2019-07-25: 17:00:00 1000 mL via INTRAVENOUS

## 2019-07-25 NOTE — ED Triage Notes (Signed)
Patient here for further evaluation of nausea and no appetite. Patient has lost 30lbs since covid diagnosis. Complains of weakness with same. Alert and oriented, denies pain. Taking nausea meds with no relief

## 2019-07-25 NOTE — ED Provider Notes (Signed)
Kyle Greene   CSN: [id number] Arrival date & time: 07/25/19  1312    History   Chief Complaint No chief complaint on file.   HPI Kyle Glazier. is a 78 y.o. male.     HPI  78 year old male who is COVID positive presents with generalized weakness and poor appetite.  The patient has been feeling this way for a couple weeks.  Started off with cough, one episode of fever, and nausea.  Continues to have nausea despite getting Zofran from his PCP.  However the main issue with not eating is lack of appetite.  He has tried some Ensure once or twice.  He has lost weight since this all began.  No current diarrhea and has not had a bowel movement a couple days.  There is no vomiting and there is no abdominal pain.  He has cough but no shortness of breath or chest pain.  Past Medical History:  Diagnosis Date  . AAA (abdominal aortic aneurysm) ([place])   . Arm pain   . GERD (gastroesophageal reflux disease)   . Gouty arthropathy   . Hearing loss   . High blood pressure   . Hyperlipemia   . Low back pain   . Over weight   . Reflux esophagitis   . Right rotator cuff tear 05/25/2019    Patient Active Problem List   Diagnosis Date Noted  . Right rotator cuff tear 05/25/2019  . Nontraumatic tear of right supraspinatus tendon 10/09/2018  . Tendinopathy of left rotator cuff 10/09/2018  . Nontraumatic rupture of long head of biceps tendon of right shoulder 10/09/2018  . 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 Right lumbar radiculopathy 05/03/2014  . H/O aortic aneurysm repair 05/03/2014  . Low back pain   . Arm pain   . Reflux esophagitis   . Gouty arthropathy   . Hearing loss   . Hyperlipemia   . Over weight     Past Surgical History:  Procedure Laterality Date  . ABDOMINAL AORTIC ANEURYSM REPAIR    . BACK SURGERY     x2  . CATARACT EXTRACTION W/PHACO Left 01/05/2018   Procedure: CATARACT EXTRACTION PHACO AND INTRAOCULAR LENS  PLACEMENT (IOC);  Surgeon: Tonny Branch, MD;  Location: [facility];  Service: Ophthalmology;  Laterality: Left;  CDE: 14.41  . CATARACT EXTRACTION W/PHACO Right 02/16/2018   Procedure: CATARACT EXTRACTION PHACO AND INTRAOCULAR LENS PLACEMENT (IOC);  Surgeon: Tonny Branch, MD;  Location: [facility];  Service: Ophthalmology;  Laterality: Right;  CDE: 6.18  . COLONOSCOPY W/ BIOPSIES AND POLYPECTOMY    . LUMBAR LAMINECTOMY/DECOMPRESSION MICRODISCECTOMY Right 06/15/2014   Procedure: LUMBAR THREE TO FOUR, LUMBAR FOUR TO FIVE LUMBAR LAMINECTOMY/DECOMPRESSION MICRODISCECTOMY 2 LEVELS;  Surgeon: Floyce Stakes, MD;  Location: [facility];  Service: Neurosurgery;  Laterality: Right;  right L3-4 L4-5 Microdiskectomy  . SHOULDER ARTHROSCOPY WITH ROTATOR CUFF REPAIR AND SUBACROMIAL DECOMPRESSION Right 05/25/2019   Procedure: SHOULDER ARTHROSCOPY WITH ROTATOR CUFF REPAIR AND SUBACROMIAL DECOMPRESSION;  Surgeon: Marchia Bond, MD;  Location: [facility];  Service: Orthopedics;  Laterality: Right;        Home Medications    Prior to Admission medications   Medication Sig Start Date End Date Taking? Authorizing Provider  allopurinol (ZYLOPRIM) 300 MG tablet Take 300 mg by mouth daily.    [provider]  baclofen (LIORESAL) 10 MG tablet Take 1 tablet (10 mg total) by mouth 3 (three) times daily. As needed for  muscle spasm 05/25/19   Marchia Bond, MD  famotidine (PEPCID) 20 MG tablet Take 20 mg by mouth daily.    [provider]  hydrochlorothiazide (MICROZIDE) 12.5 MG capsule Take 12.5 mg by mouth daily.    [provider]  HYDROcodone-acetaminophen (NORCO) 10-325 MG tablet Take 1 tablet by mouth every 6 (six) hours as needed. 05/25/19   Marchia Bond, MD  indomethacin (INDOCIN) 50 MG capsule Take 50 mg by mouth 2 (two) times daily as needed (gout pain).    [provider]  ondansetron (ZOFRAN) 4 MG tablet Take 1 tablet (4 mg total) by mouth every 8 (eight) hours as  needed for nausea or vomiting. 05/25/19   Marchia Bond, MD  sennosides-docusate sodium (SENOKOT-S) 8.6-50 MG tablet Take 2 tablets by mouth daily. 05/25/19   Marchia Bond, MD    Family History Family History  Problem Relation Age of Onset  . COPD Mother   . Cancer Father     Social History Social History   Tobacco Use  . Smoking status: Former Smoker    Types: Cigarettes    Quit date: 1997    Years since quitting: 23.6  . Smokeless tobacco: Never Used  . Tobacco comment: Quit 20 years ago.  Substance Use Topics  . Alcohol use: No  . Drug use: No     Allergies   Statins   Review of Systems Review of Systems  Constitutional: Positive for unexpected weight change. Negative for fever.  Respiratory: Positive for cough. Negative for shortness of breath.   Gastrointestinal: Positive for nausea. Negative for diarrhea and vomiting.  All other systems reviewed and are negative.    Physical Exam Updated Vital Signs BP (!) 117/55   Pulse 63   Temp 99.1 F (37.3 C) (Oral)   Resp 18   Ht 5\' 8"  (1.727 m)   Wt 60.8 kg   SpO2 93%   BMI 20.37 kg/m   Physical Exam Vitals signs and nursing Greene reviewed.  Constitutional:      General: He is not in acute distress.    Appearance: He is well-developed. He is not ill-appearing or diaphoretic.  HENT:     Head: Normocephalic and atraumatic.     Right Ear: External ear normal.     Left Ear: External ear normal.     Nose: Nose normal.  Eyes:     General:        Right eye: No discharge.        Left eye: No discharge.  Neck:     Musculoskeletal: Neck supple.  Cardiovascular:     Rate and Rhythm: Normal rate and regular rhythm.  Pulmonary:     Effort: Pulmonary effort is normal.  Abdominal:     General: There is no distension.     Palpations: Abdomen is soft.     Tenderness: There is no abdominal tenderness.  Skin:    General: Skin is warm and dry.  Neurological:     Mental Status: He is alert.     Comments: 5/5  strength in all 4 extremities.   Psychiatric:        Mood and Affect: Mood is not anxious.      ED Treatments / Results  Labs (all labs ordered are listed, but only abnormal results are displayed) Labs Reviewed  COMPREHENSIVE METABOLIC PANEL - Abnormal; Notable for the following components:      Result Value   Glucose, Bld 133 (*)    BUN 36 (*)  Albumin 3.0 (*)    AST 56 (*)    GFR calc non Af Amer 56 (*)    All other components within normal limits  CBC - Abnormal; Notable for the following components:   WBC 11.8 (*)    All other components within normal limits    EKG None  Radiology No results found.  Procedures Procedures (including critical care time)  Medications Ordered in ED Medications  sodium chloride flush (NS) 0.9 % injection 3 mL (has no administration in time range)  sodium chloride 0.9 % bolus 1,000 mL (1,000 mLs Intravenous New Bag/Given 07/25/19 1706)     Initial Impression / Assessment and Plan / ED Course  I have reviewed the triage vital signs and the nursing notes.  Pertinent labs & imaging results that were available during my care of the patient were reviewed by me and considered in my medical decision making (see chart for details).        Patient's exam is pretty benign.  His vital signs are stable.  No hypoxia.  Labs are reassuring.  No electrolyte disturbance or evidence of renal disease/failure.  He will be given some fluids but otherwise I think there is no emergent finding present.  There is no abdominal tenderness or pain or swelling to be concerned with needing a CT abdomen pelvis.  Otherwise, I discussed he should follow-up with PCP for meds to help with appetite.  I discussed this with his wife as well.  Will discharge home.  Malacai Grantz. was evaluated in Emergency Department on 07/25/2019 for the symptoms described in the history of present illness. He was evaluated in the context of the global COVID-19 pandemic, which  necessitated consideration that the patient might be at risk for infection with the SARS-CoV-2 virus that causes COVID-19. Institutional protocols and algorithms that pertain to the evaluation of patients at risk for COVID-19 are in a state of rapid change based on information released by regulatory bodies including the CDC and federal and state organizations. These policies and algorithms were followed during the patient's care in the ED.   Final Clinical Impressions(s) / ED Diagnoses   Final diagnoses:  Decreased appetite  COVID-19 virus infection    ED Discharge Orders    None       Sherwood Gambler, MD 07/25/19 [phone]

## 2019-08-06 DIAGNOSIS — R634 Abnormal weight loss: Secondary | ICD-10-CM | POA: Diagnosis not present

## 2019-08-06 DIAGNOSIS — Z9889 Other specified postprocedural states: Secondary | ICD-10-CM | POA: Diagnosis not present

## 2019-08-13 DIAGNOSIS — M25511 Pain in right shoulder: Secondary | ICD-10-CM | POA: Diagnosis not present

## 2019-08-13 DIAGNOSIS — M75121 Complete rotator cuff tear or rupture of right shoulder, not specified as traumatic: Secondary | ICD-10-CM | POA: Diagnosis not present

## 2019-08-13 DIAGNOSIS — M6281 Muscle weakness (generalized): Secondary | ICD-10-CM | POA: Diagnosis not present

## 2019-08-13 DIAGNOSIS — M25611 Stiffness of right shoulder, not elsewhere classified: Secondary | ICD-10-CM | POA: Diagnosis not present

## 2019-08-17 DIAGNOSIS — M25511 Pain in right shoulder: Secondary | ICD-10-CM | POA: Diagnosis not present

## 2019-08-17 DIAGNOSIS — M6281 Muscle weakness (generalized): Secondary | ICD-10-CM | POA: Diagnosis not present

## 2019-08-17 DIAGNOSIS — M75121 Complete rotator cuff tear or rupture of right shoulder, not specified as traumatic: Secondary | ICD-10-CM | POA: Diagnosis not present

## 2019-08-17 DIAGNOSIS — M25611 Stiffness of right shoulder, not elsewhere classified: Secondary | ICD-10-CM | POA: Diagnosis not present

## 2019-08-19 DIAGNOSIS — M6281 Muscle weakness (generalized): Secondary | ICD-10-CM | POA: Diagnosis not present

## 2019-08-19 DIAGNOSIS — M75121 Complete rotator cuff tear or rupture of right shoulder, not specified as traumatic: Secondary | ICD-10-CM | POA: Diagnosis not present

## 2019-08-19 DIAGNOSIS — M25511 Pain in right shoulder: Secondary | ICD-10-CM | POA: Diagnosis not present

## 2019-08-19 DIAGNOSIS — M25611 Stiffness of right shoulder, not elsewhere classified: Secondary | ICD-10-CM | POA: Diagnosis not present

## 2019-08-24 DIAGNOSIS — M25511 Pain in right shoulder: Secondary | ICD-10-CM | POA: Diagnosis not present

## 2019-08-24 DIAGNOSIS — M6281 Muscle weakness (generalized): Secondary | ICD-10-CM | POA: Diagnosis not present

## 2019-08-24 DIAGNOSIS — M25611 Stiffness of right shoulder, not elsewhere classified: Secondary | ICD-10-CM | POA: Diagnosis not present

## 2019-08-24 DIAGNOSIS — M75121 Complete rotator cuff tear or rupture of right shoulder, not specified as traumatic: Secondary | ICD-10-CM | POA: Diagnosis not present

## 2019-08-26 DIAGNOSIS — M25611 Stiffness of right shoulder, not elsewhere classified: Secondary | ICD-10-CM | POA: Diagnosis not present

## 2019-08-26 DIAGNOSIS — M25511 Pain in right shoulder: Secondary | ICD-10-CM | POA: Diagnosis not present

## 2019-08-26 DIAGNOSIS — M6281 Muscle weakness (generalized): Secondary | ICD-10-CM | POA: Diagnosis not present

## 2019-08-26 DIAGNOSIS — M75121 Complete rotator cuff tear or rupture of right shoulder, not specified as traumatic: Secondary | ICD-10-CM | POA: Diagnosis not present

## 2019-09-02 DIAGNOSIS — M6281 Muscle weakness (generalized): Secondary | ICD-10-CM | POA: Diagnosis not present

## 2019-09-02 DIAGNOSIS — M25611 Stiffness of right shoulder, not elsewhere classified: Secondary | ICD-10-CM | POA: Diagnosis not present

## 2019-09-02 DIAGNOSIS — M75121 Complete rotator cuff tear or rupture of right shoulder, not specified as traumatic: Secondary | ICD-10-CM | POA: Diagnosis not present

## 2019-09-03 DIAGNOSIS — M25611 Stiffness of right shoulder, not elsewhere classified: Secondary | ICD-10-CM | POA: Diagnosis not present

## 2019-09-03 DIAGNOSIS — M25511 Pain in right shoulder: Secondary | ICD-10-CM | POA: Diagnosis not present

## 2019-09-03 DIAGNOSIS — M75121 Complete rotator cuff tear or rupture of right shoulder, not specified as traumatic: Secondary | ICD-10-CM | POA: Diagnosis not present

## 2019-09-03 DIAGNOSIS — M6281 Muscle weakness (generalized): Secondary | ICD-10-CM | POA: Diagnosis not present

## 2019-09-07 DIAGNOSIS — M75121 Complete rotator cuff tear or rupture of right shoulder, not specified as traumatic: Secondary | ICD-10-CM | POA: Diagnosis not present

## 2019-09-07 DIAGNOSIS — M25611 Stiffness of right shoulder, not elsewhere classified: Secondary | ICD-10-CM | POA: Diagnosis not present

## 2019-09-07 DIAGNOSIS — M25511 Pain in right shoulder: Secondary | ICD-10-CM | POA: Diagnosis not present

## 2019-09-07 DIAGNOSIS — M6281 Muscle weakness (generalized): Secondary | ICD-10-CM | POA: Diagnosis not present

## 2019-09-09 DIAGNOSIS — M25511 Pain in right shoulder: Secondary | ICD-10-CM | POA: Diagnosis not present

## 2019-09-09 DIAGNOSIS — M6281 Muscle weakness (generalized): Secondary | ICD-10-CM | POA: Diagnosis not present

## 2019-09-09 DIAGNOSIS — M75121 Complete rotator cuff tear or rupture of right shoulder, not specified as traumatic: Secondary | ICD-10-CM | POA: Diagnosis not present

## 2019-09-09 DIAGNOSIS — M25611 Stiffness of right shoulder, not elsewhere classified: Secondary | ICD-10-CM | POA: Diagnosis not present

## 2019-09-14 DIAGNOSIS — M25611 Stiffness of right shoulder, not elsewhere classified: Secondary | ICD-10-CM | POA: Diagnosis not present

## 2019-09-14 DIAGNOSIS — M25511 Pain in right shoulder: Secondary | ICD-10-CM | POA: Diagnosis not present

## 2019-09-14 DIAGNOSIS — M6281 Muscle weakness (generalized): Secondary | ICD-10-CM | POA: Diagnosis not present

## 2019-09-14 DIAGNOSIS — M75121 Complete rotator cuff tear or rupture of right shoulder, not specified as traumatic: Secondary | ICD-10-CM | POA: Diagnosis not present

## 2019-09-16 DIAGNOSIS — M75121 Complete rotator cuff tear or rupture of right shoulder, not specified as traumatic: Secondary | ICD-10-CM | POA: Diagnosis not present

## 2019-09-16 DIAGNOSIS — M6281 Muscle weakness (generalized): Secondary | ICD-10-CM | POA: Diagnosis not present

## 2019-09-16 DIAGNOSIS — M25611 Stiffness of right shoulder, not elsewhere classified: Secondary | ICD-10-CM | POA: Diagnosis not present

## 2019-09-16 DIAGNOSIS — M25511 Pain in right shoulder: Secondary | ICD-10-CM | POA: Diagnosis not present

## 2019-09-20 DIAGNOSIS — M25511 Pain in right shoulder: Secondary | ICD-10-CM | POA: Diagnosis not present

## 2019-09-20 DIAGNOSIS — M6281 Muscle weakness (generalized): Secondary | ICD-10-CM | POA: Diagnosis not present

## 2019-09-20 DIAGNOSIS — M25611 Stiffness of right shoulder, not elsewhere classified: Secondary | ICD-10-CM | POA: Diagnosis not present

## 2019-09-20 DIAGNOSIS — M75121 Complete rotator cuff tear or rupture of right shoulder, not specified as traumatic: Secondary | ICD-10-CM | POA: Diagnosis not present

## 2019-09-22 DIAGNOSIS — M25611 Stiffness of right shoulder, not elsewhere classified: Secondary | ICD-10-CM | POA: Diagnosis not present

## 2019-09-22 DIAGNOSIS — M25511 Pain in right shoulder: Secondary | ICD-10-CM | POA: Diagnosis not present

## 2019-09-22 DIAGNOSIS — C4441 Basal cell carcinoma of skin of scalp and neck: Secondary | ICD-10-CM | POA: Diagnosis not present

## 2019-09-22 DIAGNOSIS — M75121 Complete rotator cuff tear or rupture of right shoulder, not specified as traumatic: Secondary | ICD-10-CM | POA: Diagnosis not present

## 2019-09-22 DIAGNOSIS — L821 Other seborrheic keratosis: Secondary | ICD-10-CM | POA: Diagnosis not present

## 2019-09-22 DIAGNOSIS — M6281 Muscle weakness (generalized): Secondary | ICD-10-CM | POA: Diagnosis not present

## 2019-09-28 DIAGNOSIS — M25511 Pain in right shoulder: Secondary | ICD-10-CM | POA: Diagnosis not present

## 2019-09-28 DIAGNOSIS — M6281 Muscle weakness (generalized): Secondary | ICD-10-CM | POA: Diagnosis not present

## 2019-09-28 DIAGNOSIS — M75121 Complete rotator cuff tear or rupture of right shoulder, not specified as traumatic: Secondary | ICD-10-CM | POA: Diagnosis not present

## 2019-09-28 DIAGNOSIS — M25611 Stiffness of right shoulder, not elsewhere classified: Secondary | ICD-10-CM | POA: Diagnosis not present

## 2019-09-30 DIAGNOSIS — M6281 Muscle weakness (generalized): Secondary | ICD-10-CM | POA: Diagnosis not present

## 2019-09-30 DIAGNOSIS — M25511 Pain in right shoulder: Secondary | ICD-10-CM | POA: Diagnosis not present

## 2019-09-30 DIAGNOSIS — M25611 Stiffness of right shoulder, not elsewhere classified: Secondary | ICD-10-CM | POA: Diagnosis not present

## 2019-09-30 DIAGNOSIS — M75121 Complete rotator cuff tear or rupture of right shoulder, not specified as traumatic: Secondary | ICD-10-CM | POA: Diagnosis not present

## 2019-10-01 DIAGNOSIS — M75121 Complete rotator cuff tear or rupture of right shoulder, not specified as traumatic: Secondary | ICD-10-CM | POA: Diagnosis not present

## 2020-01-28 DIAGNOSIS — R55 Syncope and collapse: Secondary | ICD-10-CM | POA: Diagnosis not present

## 2020-01-28 DIAGNOSIS — R42 Dizziness and giddiness: Secondary | ICD-10-CM | POA: Diagnosis not present

## 2020-01-28 DIAGNOSIS — Z8639 Personal history of other endocrine, nutritional and metabolic disease: Secondary | ICD-10-CM | POA: Diagnosis not present

## 2020-01-28 DIAGNOSIS — Z79899 Other long term (current) drug therapy: Secondary | ICD-10-CM | POA: Diagnosis not present

## 2020-01-28 DIAGNOSIS — E860 Dehydration: Secondary | ICD-10-CM | POA: Diagnosis not present

## 2020-01-28 DIAGNOSIS — K219 Gastro-esophageal reflux disease without esophagitis: Secondary | ICD-10-CM | POA: Diagnosis not present

## 2020-01-28 DIAGNOSIS — I10 Essential (primary) hypertension: Secondary | ICD-10-CM | POA: Diagnosis not present

## 2020-01-29 DIAGNOSIS — R55 Syncope and collapse: Secondary | ICD-10-CM | POA: Diagnosis not present

## 2020-02-03 DIAGNOSIS — M654 Radial styloid tenosynovitis [de Quervain]: Secondary | ICD-10-CM | POA: Diagnosis not present

## 2020-02-03 DIAGNOSIS — G5603 Carpal tunnel syndrome, bilateral upper limbs: Secondary | ICD-10-CM | POA: Diagnosis not present

## 2020-02-21 DIAGNOSIS — M654 Radial styloid tenosynovitis [de Quervain]: Secondary | ICD-10-CM | POA: Diagnosis not present

## 2020-03-20 DIAGNOSIS — M654 Radial styloid tenosynovitis [de Quervain]: Secondary | ICD-10-CM | POA: Diagnosis not present

## 2020-03-22 DIAGNOSIS — M25631 Stiffness of right wrist, not elsewhere classified: Secondary | ICD-10-CM | POA: Diagnosis not present

## 2020-03-22 DIAGNOSIS — M25531 Pain in right wrist: Secondary | ICD-10-CM | POA: Diagnosis not present

## 2020-03-22 DIAGNOSIS — M654 Radial styloid tenosynovitis [de Quervain]: Secondary | ICD-10-CM | POA: Diagnosis not present

## 2020-03-27 DIAGNOSIS — M25531 Pain in right wrist: Secondary | ICD-10-CM | POA: Diagnosis not present

## 2020-03-27 DIAGNOSIS — M25631 Stiffness of right wrist, not elsewhere classified: Secondary | ICD-10-CM | POA: Diagnosis not present

## 2020-03-27 DIAGNOSIS — M654 Radial styloid tenosynovitis [de Quervain]: Secondary | ICD-10-CM | POA: Diagnosis not present

## 2020-03-29 DIAGNOSIS — M654 Radial styloid tenosynovitis [de Quervain]: Secondary | ICD-10-CM | POA: Diagnosis not present

## 2020-03-29 DIAGNOSIS — M25631 Stiffness of right wrist, not elsewhere classified: Secondary | ICD-10-CM | POA: Diagnosis not present

## 2020-03-29 DIAGNOSIS — M25531 Pain in right wrist: Secondary | ICD-10-CM | POA: Diagnosis not present

## 2020-04-03 DIAGNOSIS — M25531 Pain in right wrist: Secondary | ICD-10-CM | POA: Diagnosis not present

## 2020-04-03 DIAGNOSIS — M25631 Stiffness of right wrist, not elsewhere classified: Secondary | ICD-10-CM | POA: Diagnosis not present

## 2020-04-03 DIAGNOSIS — M654 Radial styloid tenosynovitis [de Quervain]: Secondary | ICD-10-CM | POA: Diagnosis not present

## 2020-04-05 DIAGNOSIS — M654 Radial styloid tenosynovitis [de Quervain]: Secondary | ICD-10-CM | POA: Diagnosis not present

## 2020-04-05 DIAGNOSIS — M25631 Stiffness of right wrist, not elsewhere classified: Secondary | ICD-10-CM | POA: Diagnosis not present

## 2020-04-05 DIAGNOSIS — M25531 Pain in right wrist: Secondary | ICD-10-CM | POA: Diagnosis not present

## 2020-04-17 DIAGNOSIS — M654 Radial styloid tenosynovitis [de Quervain]: Secondary | ICD-10-CM | POA: Diagnosis not present

## 2020-05-24 DIAGNOSIS — Z961 Presence of intraocular lens: Secondary | ICD-10-CM | POA: Diagnosis not present

## 2020-05-24 DIAGNOSIS — H524 Presbyopia: Secondary | ICD-10-CM | POA: Diagnosis not present

## 2020-05-24 DIAGNOSIS — H353131 Nonexudative age-related macular degeneration, bilateral, early dry stage: Secondary | ICD-10-CM | POA: Diagnosis not present

## 2020-07-13 DIAGNOSIS — Z125 Encounter for screening for malignant neoplasm of prostate: Secondary | ICD-10-CM | POA: Diagnosis not present

## 2020-07-13 DIAGNOSIS — I10 Essential (primary) hypertension: Secondary | ICD-10-CM | POA: Diagnosis not present

## 2020-07-13 DIAGNOSIS — K219 Gastro-esophageal reflux disease without esophagitis: Secondary | ICD-10-CM | POA: Diagnosis not present

## 2020-07-13 DIAGNOSIS — M109 Gout, unspecified: Secondary | ICD-10-CM | POA: Diagnosis not present

## 2020-07-13 DIAGNOSIS — L84 Corns and callosities: Secondary | ICD-10-CM | POA: Diagnosis not present

## 2020-07-13 DIAGNOSIS — Z0000 Encounter for general adult medical examination without abnormal findings: Secondary | ICD-10-CM | POA: Diagnosis not present

## 2020-07-13 DIAGNOSIS — L570 Actinic keratosis: Secondary | ICD-10-CM | POA: Diagnosis not present

## 2020-07-28 DIAGNOSIS — M1712 Unilateral primary osteoarthritis, left knee: Secondary | ICD-10-CM | POA: Diagnosis not present

## 2020-08-25 DIAGNOSIS — M1712 Unilateral primary osteoarthritis, left knee: Secondary | ICD-10-CM | POA: Diagnosis not present

## 2021-04-19 DIAGNOSIS — J3489 Other specified disorders of nose and nasal sinuses: Secondary | ICD-10-CM | POA: Diagnosis not present

## 2021-04-19 DIAGNOSIS — Z20822 Contact with and (suspected) exposure to covid-19: Secondary | ICD-10-CM | POA: Diagnosis not present

## 2021-04-19 DIAGNOSIS — L299 Pruritus, unspecified: Secondary | ICD-10-CM | POA: Diagnosis not present

## 2021-04-19 DIAGNOSIS — R0602 Shortness of breath: Secondary | ICD-10-CM | POA: Diagnosis not present

## 2021-04-19 DIAGNOSIS — Z87891 Personal history of nicotine dependence: Secondary | ICD-10-CM | POA: Diagnosis not present

## 2021-04-19 DIAGNOSIS — R067 Sneezing: Secondary | ICD-10-CM | POA: Diagnosis not present

## 2021-04-19 DIAGNOSIS — H6123 Impacted cerumen, bilateral: Secondary | ICD-10-CM | POA: Diagnosis not present

## 2021-04-27 DIAGNOSIS — R42 Dizziness and giddiness: Secondary | ICD-10-CM | POA: Diagnosis not present

## 2021-05-11 DIAGNOSIS — M1712 Unilateral primary osteoarthritis, left knee: Secondary | ICD-10-CM | POA: Diagnosis not present

## 2021-06-13 DIAGNOSIS — C44310 Basal cell carcinoma of skin of unspecified parts of face: Secondary | ICD-10-CM | POA: Diagnosis not present

## 2021-06-13 DIAGNOSIS — C44319 Basal cell carcinoma of skin of other parts of face: Secondary | ICD-10-CM | POA: Diagnosis not present

## 2021-08-01 DIAGNOSIS — Z125 Encounter for screening for malignant neoplasm of prostate: Secondary | ICD-10-CM | POA: Diagnosis not present

## 2021-08-01 DIAGNOSIS — M109 Gout, unspecified: Secondary | ICD-10-CM | POA: Diagnosis not present

## 2021-08-01 DIAGNOSIS — L299 Pruritus, unspecified: Secondary | ICD-10-CM | POA: Diagnosis not present

## 2021-08-01 DIAGNOSIS — Z0000 Encounter for general adult medical examination without abnormal findings: Secondary | ICD-10-CM | POA: Diagnosis not present

## 2021-08-01 DIAGNOSIS — I10 Essential (primary) hypertension: Secondary | ICD-10-CM | POA: Diagnosis not present

## 2021-08-01 DIAGNOSIS — K5900 Constipation, unspecified: Secondary | ICD-10-CM | POA: Diagnosis not present

## 2024-09-27 ENCOUNTER — Encounter: Payer: Self-pay | Admitting: Physician Assistant

## 2024-12-13 ENCOUNTER — Encounter

## 2024-12-13 ENCOUNTER — Encounter: Payer: Self-pay | Admitting: Physician Assistant
